# Patient Record
Sex: Female | Born: 1990 | Race: Black or African American | Hispanic: No | Marital: Single | Smoking: Never smoker
Health system: Southern US, Community
[De-identification: ages and names within clinical notes are randomized; demographics above are authoritative.]

## PROBLEM LIST (undated history)

## (undated) ENCOUNTER — Inpatient Hospital Stay (HOSPITAL_COMMUNITY): Payer: Self-pay

## (undated) DIAGNOSIS — D649 Anemia, unspecified: Secondary | ICD-10-CM

## (undated) DIAGNOSIS — D573 Sickle-cell trait: Secondary | ICD-10-CM

## (undated) HISTORY — PX: UMBILICAL HERNIA REPAIR: SHX196

---
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
## 2009-07-12 ENCOUNTER — Emergency Department (HOSPITAL_COMMUNITY): Admission: EM | Admit: 2009-07-12 | Discharge: 2009-07-12 | Payer: Self-pay | Admitting: Emergency Medicine

## 2009-07-12 IMAGING — CT CT ABD-PELV W/O CM
2 of 4 series · 17 of 46 positions shown, 19 images · non-contrast
Comparison: None

CLINICAL DATA: Right flank and anterior abdominal pain for 1 week
getting progressive worse.

CT ABDOMEN AND PELVIS WITHOUT CONTRAST
TECHNIQUE: Multidetector CT imaging of the abdomen and pelvis was
performed following the standard protocol without intravenous
contrast.

[Series 2: under 200# stone no prev · axial · 0.67mm/px · z∈[-512,-72]mm · 14 of 96 slices shown, 16 images]
[im 4/96  soft-tissue]
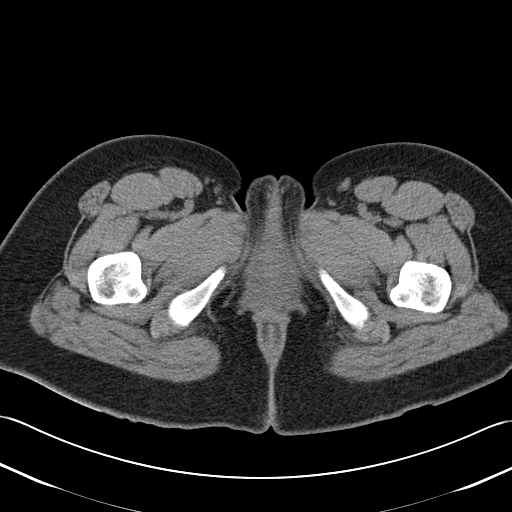
[im 4/96  bone]
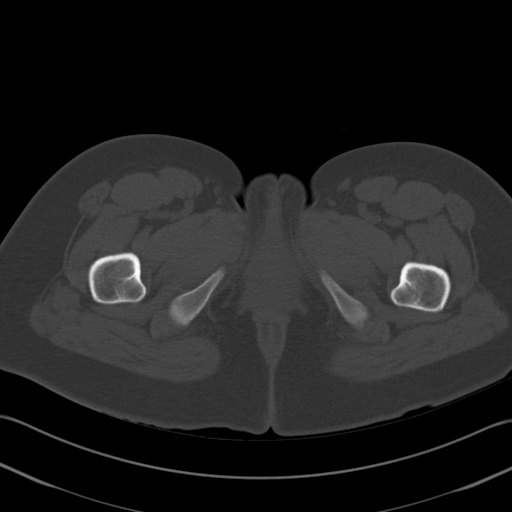
[im 11/96  soft-tissue]
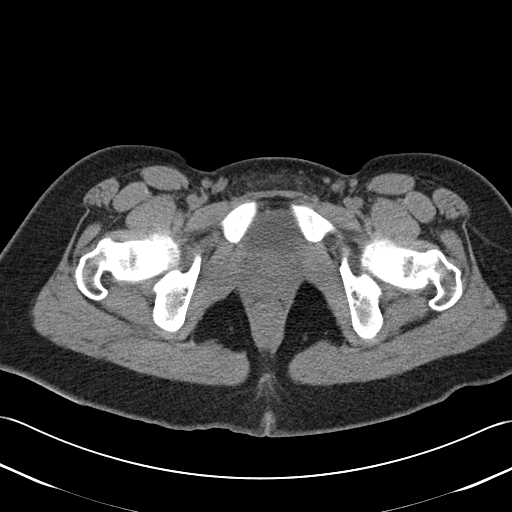
[im 19/96  soft-tissue]
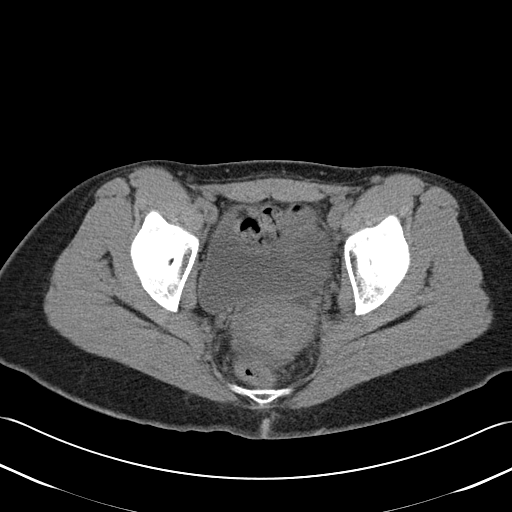
[im 26/96  soft-tissue]
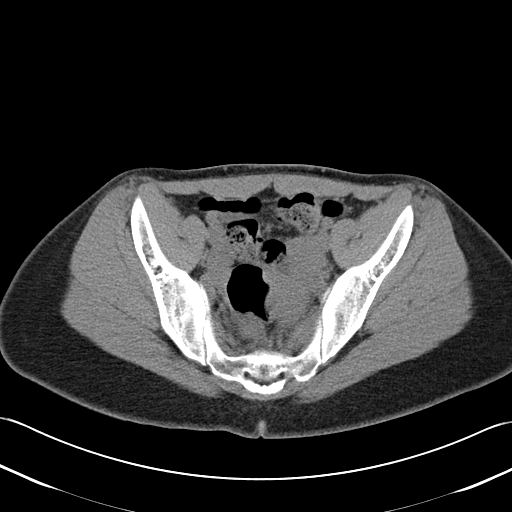
[im 33/96  soft-tissue]
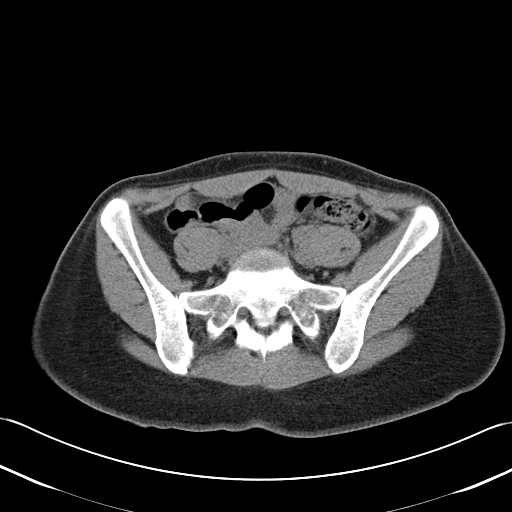
[im 37/96  soft-tissue]
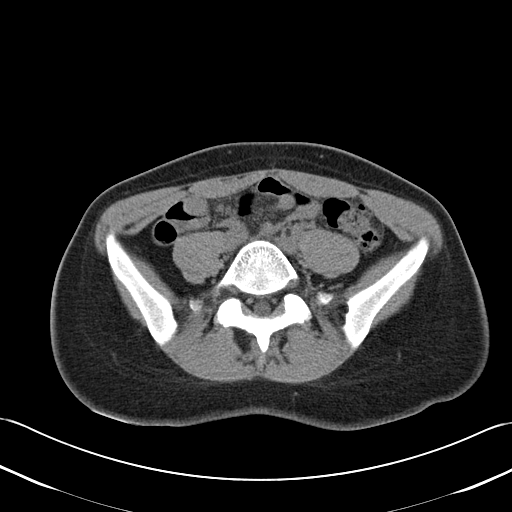
[im 44/96  soft-tissue]
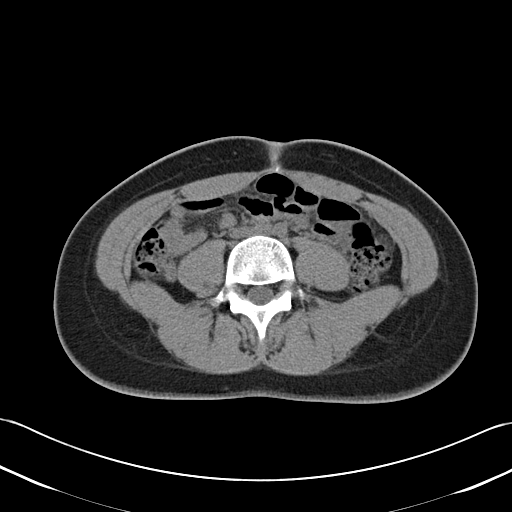
[im 52/96  soft-tissue]
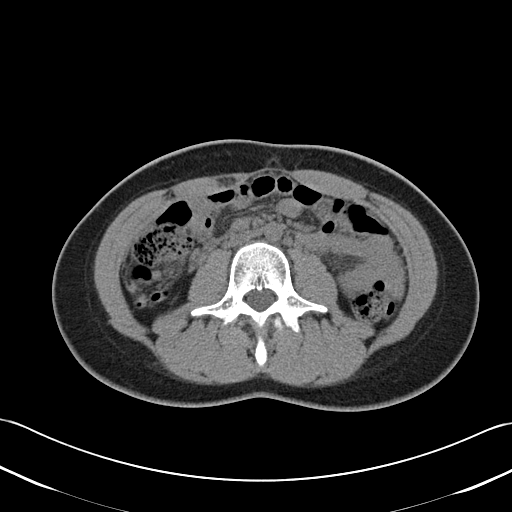
[im 59/96  soft-tissue]
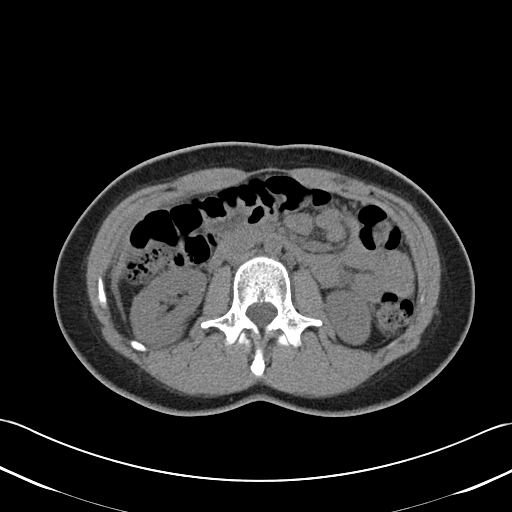
[im 59/96  bone]
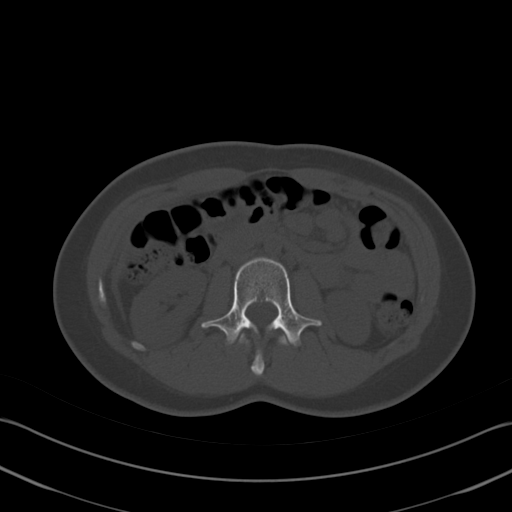
[im 63/96  soft-tissue]
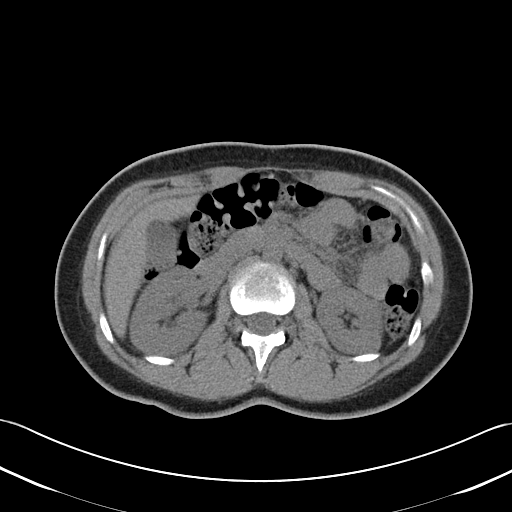
[im 70/96  soft-tissue]
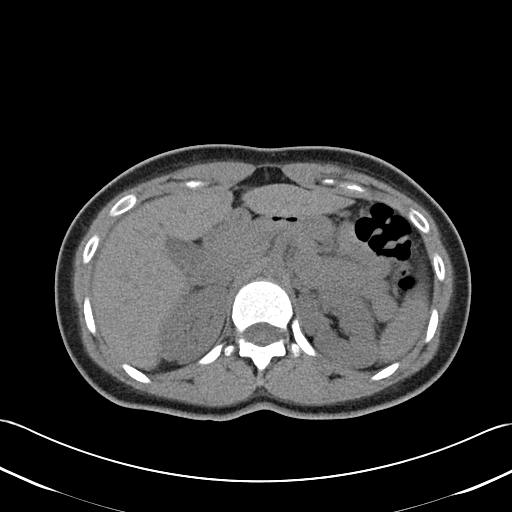
[im 77/96  soft-tissue]
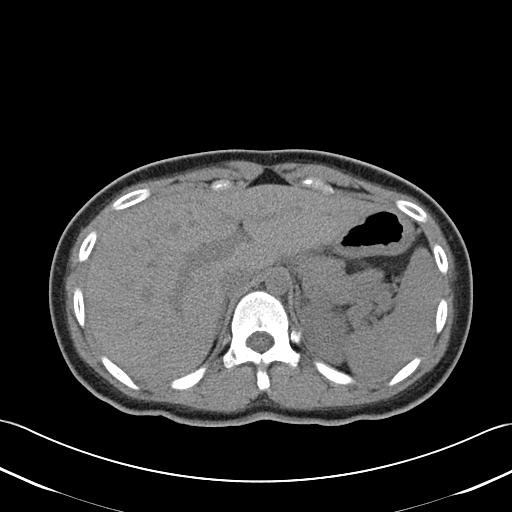
[im 85/96  soft-tissue]
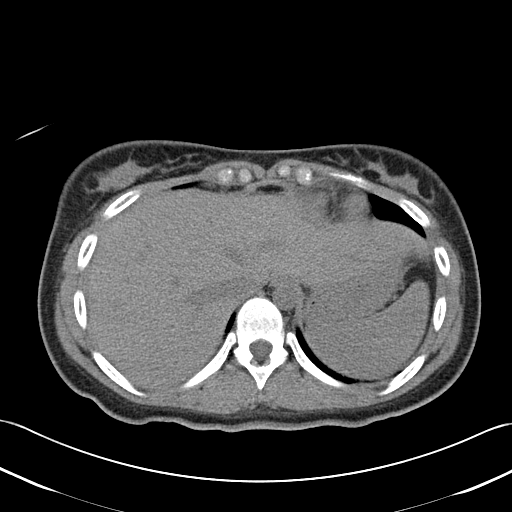
[im 92/96  soft-tissue]
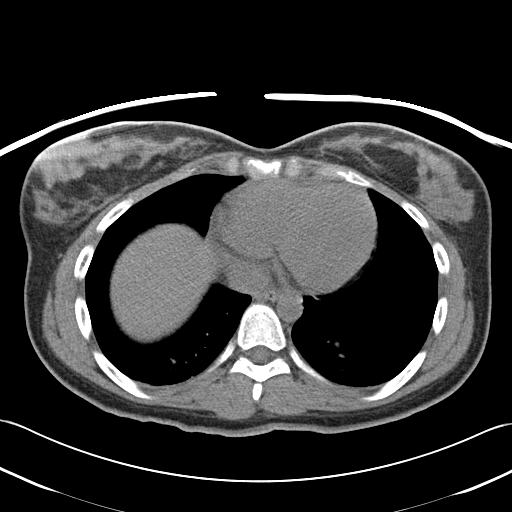

[Series 602: <mpr thick range> · coronal · 0.94mm/px · 3 of 65 slices shown]
[im 22/65  soft-tissue]
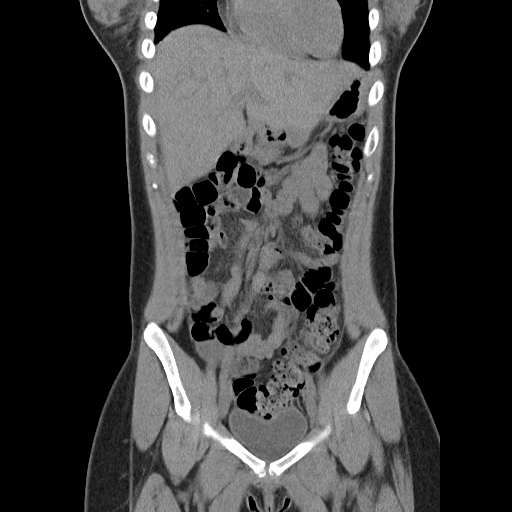
[im 29/65  soft-tissue]
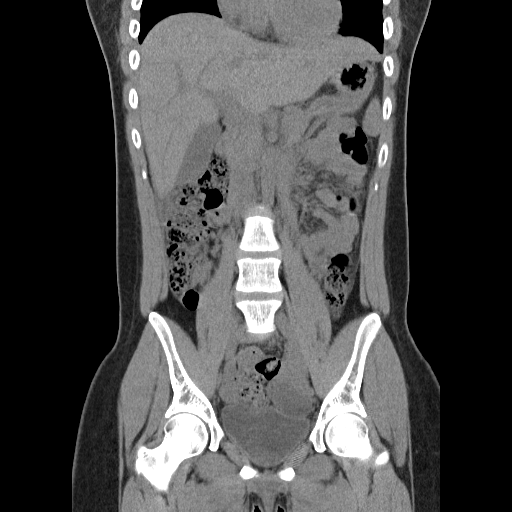
[im 36/65  soft-tissue]
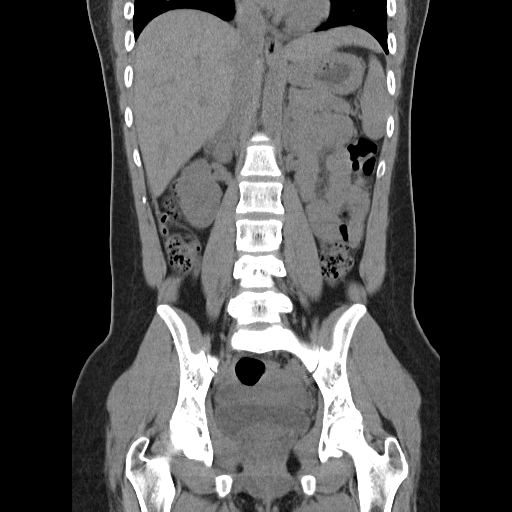

[17 of 46 positions shown; findings below may reference images not displayed]

FINDINGS: The liver, spleen, pancreas, adrenal glands, and kidneys
are normal.  The appendix and terminal ileum are normal. There is
minimal nonspecific fluid in the right pericolic gutter just above
the appendix which is in the right mid abdomen. There is no free
air or free fluid in the abdomen.

The uterus is normal.  The left ovary measures  4 cm and the right
ovary measures  3.2 cm.  There may be cysts on both ovaries.  There
is a tiny amount of free fluid in the pelvis, nonspecific.
IMPRESSION: Slight prominence of  the left ovary with a possible ovarian cyst.

Tiny amount of fluid in the right pericolic gutter adjacent to the
appendix in the right mid abdomen, nonspecific.  The appendix
appears normal.

## 2009-07-15 ENCOUNTER — Emergency Department (HOSPITAL_COMMUNITY): Admission: EM | Admit: 2009-07-15 | Discharge: 2009-07-16 | Payer: Self-pay | Admitting: Emergency Medicine

## 2010-06-19 NOTE — L&D Delivery Note (Signed)
Delivery Note At 1:52 AM a viable female was delivered via Vaginal, Spontaneous Delivery (Presentation: Left Occiput Anterior).  APGAR: 8, 9; weight 6 lb 8 oz (2948 g).   Placenta status: Intact, Spontaneous.  Cord: 3 vessels with the following complications: None.  Cord pH: not done  Anesthesia: Epidural  Episiotomy:  Lacerations: 1st degree Suture Repair: 2.0 vicryl Est. Blood Loss (mL):   Mom to postpartum.  Baby to nursery-stable.  MARSHALL,BERNARD A 04/04/2011, 2:06 AM

## 2010-08-20 ENCOUNTER — Inpatient Hospital Stay (INDEPENDENT_AMBULATORY_CARE_PROVIDER_SITE_OTHER)
Admission: RE | Admit: 2010-08-20 | Discharge: 2010-08-20 | Disposition: A | Discharge status: Home / Self Care | Payer: Self-pay | Source: Ambulatory Visit | Attending: Family Medicine | Admitting: Family Medicine

## 2010-08-20 DIAGNOSIS — L03019 Cellulitis of unspecified finger: Secondary | ICD-10-CM

## 2010-09-05 LAB — CBC
HCT: 32.8 % — ABNORMAL LOW (ref 36.0–46.0)
Hemoglobin: 11 g/dL — ABNORMAL LOW (ref 12.0–15.0)
MCHC: 33.5 g/dL (ref 30.0–36.0)
WBC: 6.2 10*3/uL (ref 4.0–10.5)

## 2010-09-05 LAB — POCT PREGNANCY, URINE: Preg Test, Ur: NEGATIVE

## 2010-09-05 LAB — URINALYSIS, ROUTINE W REFLEX MICROSCOPIC
Bilirubin Urine: NEGATIVE
Glucose, UA: NEGATIVE mg/dL
Specific Gravity, Urine: 1.013 (ref 1.005–1.030)

## 2010-09-05 LAB — COMPREHENSIVE METABOLIC PANEL
ALT: 9 U/L (ref 0–35)
AST: 13 U/L (ref 0–37)
CO2: 25 mEq/L (ref 19–32)
Calcium: 9 mg/dL (ref 8.4–10.5)
GFR calc Af Amer: >60 mL/min (ref >60)
Potassium: 3.2 mEq/L — ABNORMAL LOW (ref 3.5–5.1)
Sodium: 139 mEq/L (ref 135–145)
Total Bilirubin: 0.3 mg/dL (ref 0.3–1.2)
Total Protein: 7.1 g/dL (ref 6.0–8.3)

## 2010-09-05 LAB — DIFFERENTIAL
Basophils Absolute: 0 10*3/uL (ref 0.0–0.1)
Basophils Relative: 0 % (ref 0–1)
Eosinophils Absolute: 0.1 10*3/uL (ref 0.0–0.7)
Lymphocytes Relative: 18 % (ref 12–46)
Monocytes Absolute: 0.4 10*3/uL (ref 0.1–1.0)
Monocytes Relative: 6 % (ref 3–12)
Neutrophils Relative %: 75 % (ref 43–77)

## 2010-09-05 LAB — PREGNANCY, URINE: Preg Test, Ur: NEGATIVE

## 2010-11-04 NOTE — Op Note (Signed)
   NAME:  Dana Mcconnell, Dana Mcconnell                        ACCOUNT NO.:  [id number]   MEDICAL RECORD NO.:  [id number]                   PATIENT TYPE:  AMB   LOCATION:  DSC                                  FACILITY:  [facility]   PHYSICIAN:  Kathy Breach, M.D.                   DATE OF BIRTH:  Sep 03, 1990   DATE OF PROCEDURE:  01/16/2003  DATE OF DISCHARGE:                                 OPERATIVE REPORT   PREOPERATIVE DIAGNOSIS:  1. Mild conductive hearing loss AS.  2. Hyperplastic obstructive tonsils.   POSTOPERATIVE DIAGNOSIS:  1. Mild conductive hearing loss AS.  2. Hyperplastic obstructive tonsils.   OPERATIVE PROCEDURE:  1. Examination under anesthesia, ears.  2. Adenotonsillectomy.   DESCRIPTION OF PROCEDURE:  Under visualization with the operating  microscope, the right external canal was cleaned of dark, moist waxy  material.  She has small external canal.  With the canal completely clean  which she would not allow in the office, the eardrum was evaluated by  microscopic visualization and appeared completely normal.  The left canal,  similarly, was cleaned adequately for visualization.  The patient had a  moderate amount of tympanosclerotic plaque in the posterior inferior  quadrant.  She had a 1 by 3 mm atrophic area anterior inferior quadrant but,  otherwise, normal, intact, nonretracted tympanic membrane with no added  retraction.   A Crowe-Davis mouth gag was inserted, the patient put in the [place]ison position.  Inspection of the oral cavity revealed 4+ enlarged tonsils extending into  the pharynx.  The soft palate was normal configuration.  The hard palate was  normal to palpation.  The tonsils were nonpulsatile on palpation.  The  nasopharyngeal mirror examination revealed fairly minimal midline adenoid  pad present.  The left tonsil was grasped at the superior pole and removed  by electrical dissection maintaining complete hemostasis with  electrocautery.  A similar procedure was  done for the right tonsil.  A red  rubber catheter was then passed through the left nasal chamber and used to  elevate the soft palate.  Under mirror visualization with suction cautery,  complete ablation of the adenoid tissue present in the nasopharynx was  completed.  Blood loss for the procedure was less than 10 mL.  The patient  tolerated the procedure well and was taken to the recovery room in stable,  general condition.                                               Kathy Breach, M.D.    Venia Minks  D:  01/16/2003  T:  01/16/2003  Job:  [id number]

## 2010-11-07 ENCOUNTER — Emergency Department (HOSPITAL_COMMUNITY)
Admission: EM | Admit: 2010-11-07 | Discharge: 2010-11-07 | Disposition: A | Discharge status: Home / Self Care | Payer: Medicaid Other | Attending: Emergency Medicine | Admitting: Emergency Medicine

## 2010-11-07 ENCOUNTER — Other Ambulatory Visit: Payer: Self-pay | Admitting: Obstetrics & Gynecology

## 2010-11-07 ENCOUNTER — Inpatient Hospital Stay (HOSPITAL_COMMUNITY)
Admission: AD | Admit: 2010-11-07 | Discharge: 2010-11-07 | Disposition: A | Discharge status: Home / Self Care | Payer: Medicaid Other | Source: Ambulatory Visit | Attending: Obstetrics & Gynecology | Admitting: Obstetrics & Gynecology

## 2010-11-07 DIAGNOSIS — N390 Urinary tract infection, site not specified: Secondary | ICD-10-CM

## 2010-11-07 DIAGNOSIS — O99019 Anemia complicating pregnancy, unspecified trimester: Secondary | ICD-10-CM | POA: Insufficient documentation

## 2010-11-07 DIAGNOSIS — O2390 Unspecified genitourinary tract infection in pregnancy, unspecified trimester: Secondary | ICD-10-CM | POA: Insufficient documentation

## 2010-11-07 DIAGNOSIS — N949 Unspecified condition associated with female genital organs and menstrual cycle: Secondary | ICD-10-CM | POA: Insufficient documentation

## 2010-11-07 DIAGNOSIS — R51 Headache: Secondary | ICD-10-CM | POA: Insufficient documentation

## 2010-11-07 DIAGNOSIS — O9989 Other specified diseases and conditions complicating pregnancy, childbirth and the puerperium: Secondary | ICD-10-CM

## 2010-11-07 DIAGNOSIS — R630 Anorexia: Secondary | ICD-10-CM | POA: Insufficient documentation

## 2010-11-07 DIAGNOSIS — R109 Unspecified abdominal pain: Secondary | ICD-10-CM | POA: Insufficient documentation

## 2010-11-07 DIAGNOSIS — J45909 Unspecified asthma, uncomplicated: Secondary | ICD-10-CM | POA: Insufficient documentation

## 2010-11-07 DIAGNOSIS — D649 Anemia, unspecified: Secondary | ICD-10-CM | POA: Insufficient documentation

## 2010-11-07 DIAGNOSIS — R350 Frequency of micturition: Secondary | ICD-10-CM | POA: Insufficient documentation

## 2010-11-07 LAB — URINALYSIS, ROUTINE W REFLEX MICROSCOPIC
Bilirubin Urine: NEGATIVE
Hgb urine dipstick: NEGATIVE
Ketones, ur: NEGATIVE mg/dL
Nitrite: NEGATIVE

## 2010-11-07 LAB — URINE MICROSCOPIC-ADD ON

## 2010-11-08 ENCOUNTER — Other Ambulatory Visit: Payer: Self-pay | Admitting: Obstetrics & Gynecology

## 2010-11-08 DIAGNOSIS — Z3689 Encounter for other specified antenatal screening: Secondary | ICD-10-CM

## 2010-11-08 LAB — CBC
HCT: 28.8 % — ABNORMAL LOW (ref 36.0–46.0)
Hemoglobin: 9.6 g/dL — ABNORMAL LOW (ref 12.0–15.0)
MCH: 30.5 pg (ref 26.0–34.0)
MCV: 91.4 fL (ref 78.0–100.0)
RBC: 3.15 MIL/uL — ABNORMAL LOW (ref 3.87–5.11)
WBC: 7.4 10*3/uL (ref 4.0–10.5)

## 2010-11-08 LAB — WET PREP, GENITAL
Clue Cells Wet Prep HPF POC: NONE SEEN
Trich, Wet Prep: NONE SEEN

## 2010-11-08 LAB — URINALYSIS, ROUTINE W REFLEX MICROSCOPIC
Hgb urine dipstick: NEGATIVE
Specific Gravity, Urine: 1.015 (ref 1.005–1.030)
Urobilinogen, UA: 0.2 mg/dL (ref 0.0–1.0)
pH: 6 (ref 5.0–8.0)

## 2010-11-08 LAB — URINE MICROSCOPIC-ADD ON

## 2010-11-08 LAB — GC/CHLAMYDIA PROBE AMP, GENITAL: Chlamydia, DNA Probe: NEGATIVE

## 2010-11-09 ENCOUNTER — Ambulatory Visit (HOSPITAL_COMMUNITY)
Admission: RE | Admit: 2010-11-09 | Discharge: 2010-11-09 | Disposition: A | Discharge status: Home / Self Care | Payer: Medicaid Other | Source: Ambulatory Visit | Attending: Obstetrics & Gynecology | Admitting: Obstetrics & Gynecology

## 2010-11-09 DIAGNOSIS — O358XX0 Maternal care for other (suspected) fetal abnormality and damage, not applicable or unspecified: Secondary | ICD-10-CM | POA: Insufficient documentation

## 2010-11-09 DIAGNOSIS — O0930 Supervision of pregnancy with insufficient antenatal care, unspecified trimester: Secondary | ICD-10-CM | POA: Insufficient documentation

## 2010-11-09 DIAGNOSIS — Z3689 Encounter for other specified antenatal screening: Secondary | ICD-10-CM

## 2010-11-09 DIAGNOSIS — Z363 Encounter for antenatal screening for malformations: Secondary | ICD-10-CM | POA: Insufficient documentation

## 2010-11-09 DIAGNOSIS — Z1389 Encounter for screening for other disorder: Secondary | ICD-10-CM | POA: Insufficient documentation

## 2010-11-10 LAB — URINE CULTURE
Colony Count: 1000
Culture  Setup Time: 201205221400

## 2010-12-03 ENCOUNTER — Inpatient Hospital Stay (HOSPITAL_COMMUNITY)
Admission: AD | Admit: 2010-12-03 | Discharge: 2010-12-03 | Disposition: A | Discharge status: Home / Self Care | Payer: Medicaid Other | Source: Ambulatory Visit | Attending: Obstetrics & Gynecology | Admitting: Obstetrics & Gynecology

## 2010-12-03 DIAGNOSIS — A5901 Trichomonal vulvovaginitis: Secondary | ICD-10-CM

## 2010-12-03 DIAGNOSIS — O98819 Other maternal infectious and parasitic diseases complicating pregnancy, unspecified trimester: Secondary | ICD-10-CM

## 2010-12-03 DIAGNOSIS — N949 Unspecified condition associated with female genital organs and menstrual cycle: Secondary | ICD-10-CM | POA: Insufficient documentation

## 2010-12-03 DIAGNOSIS — O0930 Supervision of pregnancy with insufficient antenatal care, unspecified trimester: Secondary | ICD-10-CM | POA: Insufficient documentation

## 2010-12-03 LAB — URINALYSIS, ROUTINE W REFLEX MICROSCOPIC
Glucose, UA: NEGATIVE mg/dL
Hgb urine dipstick: NEGATIVE
Nitrite: NEGATIVE
Specific Gravity, Urine: 1.02 (ref 1.005–1.030)
pH: 8 (ref 5.0–8.0)

## 2010-12-03 LAB — URINE MICROSCOPIC-ADD ON

## 2010-12-18 ENCOUNTER — Inpatient Hospital Stay (HOSPITAL_COMMUNITY)
Admission: AD | Admit: 2010-12-18 | Discharge: 2010-12-18 | Disposition: A | Discharge status: Home / Self Care | Payer: Medicaid Other | Source: Ambulatory Visit | Attending: Obstetrics & Gynecology | Admitting: Obstetrics & Gynecology

## 2010-12-18 DIAGNOSIS — IMO0002 Reserved for concepts with insufficient information to code with codable children: Secondary | ICD-10-CM | POA: Insufficient documentation

## 2010-12-18 LAB — URINALYSIS, ROUTINE W REFLEX MICROSCOPIC
Bilirubin Urine: NEGATIVE
Hgb urine dipstick: NEGATIVE
Nitrite: NEGATIVE
Specific Gravity, Urine: 1.02 (ref 1.005–1.030)
Urobilinogen, UA: 0.2 mg/dL (ref 0.0–1.0)
pH: 6 (ref 5.0–8.0)

## 2011-01-16 LAB — RPR: RPR: NONREACTIVE

## 2011-01-16 LAB — RUBELLA ANTIBODY, IGM: Rubella: IMMUNE

## 2011-01-16 LAB — GC/CHLAMYDIA PROBE AMP, GENITAL: Gonorrhea: NEGATIVE

## 2011-01-16 LAB — HIV ANTIBODY (ROUTINE TESTING W REFLEX)
HIV: NONREACTIVE
HIV: NONREACTIVE
HIV: NONREACTIVE

## 2011-01-16 LAB — HEPATITIS B SURFACE ANTIGEN: Hepatitis B Surface Ag: NEGATIVE

## 2011-01-16 LAB — ANTIBODY SCREEN: Antibody Screen: NEGATIVE

## 2011-01-16 LAB — ABO/RH: RH Type: POSITIVE

## 2011-01-30 ENCOUNTER — Inpatient Hospital Stay (HOSPITAL_COMMUNITY)
Admission: AD | Admit: 2011-01-30 | Discharge: 2011-01-30 | Disposition: A | Discharge status: Home / Self Care | Payer: Medicaid Other | Source: Ambulatory Visit | Attending: Obstetrics | Admitting: Obstetrics

## 2011-01-30 ENCOUNTER — Encounter (HOSPITAL_COMMUNITY): Payer: Self-pay | Admitting: *Deleted

## 2011-01-30 DIAGNOSIS — R109 Unspecified abdominal pain: Secondary | ICD-10-CM | POA: Insufficient documentation

## 2011-01-30 DIAGNOSIS — O99891 Other specified diseases and conditions complicating pregnancy: Secondary | ICD-10-CM | POA: Insufficient documentation

## 2011-01-30 DIAGNOSIS — N949 Unspecified condition associated with female genital organs and menstrual cycle: Secondary | ICD-10-CM

## 2011-01-30 MED ORDER — ACETAMINOPHEN 325 MG PO TABS
650.0000 mg | ORAL_TABLET | Freq: Once | ORAL | Status: AC
  Administered 2011-01-30: 650 mg via ORAL
  Filled 2011-01-30: qty 2

## 2011-01-30 NOTE — Progress Notes (Signed)
DENIES HSV AND MRSA. NOT SROM

## 2011-01-30 NOTE — Progress Notes (Signed)
Pt says at 0700   SHE STARTED HAVING PAIN IN HER R HIP.  HAS NOT TAKEN ANYTHING  FOR PAIN.  PAIN IS SAME  THRU DAY.    WALKING INTO TRIAGE - LIMPING.

## 2011-01-30 NOTE — ED Provider Notes (Signed)
No chief complaint on file.   S: Dana Mcconnell is a 20 y.o. G1 at 31 weeks presenting with 1 day hx of sharp continuous pain in r.t hip and rt. groin. The pain is exacerbated by walking and changing positions.Now at rest she denies pain.  No dysuria, urgency or frequency. She denies contractions, vaginal bleeding or leakage of fluid. Fetus is active.  ROS: Rt frontal headache and has not tried Tylenol Pregnancy uncomplicated except iron defic anemia.  O: Filed Vitals:   01/30/11 2022  BP: 115/58  Pulse: 86  Temp:   Resp: 20    Gen: NAD Mouth: gum hypertrophy and erythema Abd: soft, mildly tender rt groin. NT over rt hip.  Cx: L/C/H FHR: 145 baseline;  Moderate variability, Accelerations15 bpm, Decelerations none UCs: none, sl UI  No results found for this or any previous visit (from the past 24 hour(s)).  A: Round Ligament Pain  P:  Reassurance given and general relief measures reviewed: avoidance of precipitating movements, instructions on abdominal tightening/pelvic rock exercises, abdominal binder, rest with hip flexion. Tx headache with Tylenol. Will give 1000 mg po now.  Work excuse x 2 days. F/U Dr. Gaynell Face

## 2011-03-16 ENCOUNTER — Other Ambulatory Visit (HOSPITAL_COMMUNITY): Payer: Self-pay | Admitting: Obstetrics

## 2011-03-16 DIAGNOSIS — IMO0002 Reserved for concepts with insufficient information to code with codable children: Secondary | ICD-10-CM

## 2011-03-20 ENCOUNTER — Ambulatory Visit (HOSPITAL_COMMUNITY)
Admission: RE | Admit: 2011-03-20 | Discharge: 2011-03-20 | Disposition: A | Discharge status: Home / Self Care | Payer: Medicaid Other | Source: Ambulatory Visit | Attending: Obstetrics | Admitting: Obstetrics

## 2011-03-20 VITALS — BP 112/71 | HR 90 | Wt 183.0 lb

## 2011-03-20 DIAGNOSIS — O365990 Maternal care for other known or suspected poor fetal growth, unspecified trimester, not applicable or unspecified: Secondary | ICD-10-CM | POA: Insufficient documentation

## 2011-03-20 DIAGNOSIS — IMO0002 Reserved for concepts with insufficient information to code with codable children: Secondary | ICD-10-CM

## 2011-03-20 DIAGNOSIS — O358XX0 Maternal care for other (suspected) fetal abnormality and damage, not applicable or unspecified: Secondary | ICD-10-CM | POA: Insufficient documentation

## 2011-03-20 DIAGNOSIS — O0930 Supervision of pregnancy with insufficient antenatal care, unspecified trimester: Secondary | ICD-10-CM | POA: Insufficient documentation

## 2011-03-20 NOTE — Progress Notes (Signed)
Report in [facility]EP[facility]; follow-up as needed 

## 2011-03-20 NOTE — Progress Notes (Signed)
Encounter addended by: Marlana Latus, RN on: 03/20/2011 11:40 AM<BR>     Documentation filed: Episodes, Chief Complaint Section

## 2011-03-23 ENCOUNTER — Inpatient Hospital Stay (HOSPITAL_COMMUNITY)
Admission: AD | Admit: 2011-03-23 | Discharge: 2011-03-23 | Disposition: A | Discharge status: Home / Self Care | Payer: Medicaid Other | Source: Ambulatory Visit | Attending: Obstetrics | Admitting: Obstetrics

## 2011-03-23 ENCOUNTER — Encounter (HOSPITAL_COMMUNITY): Payer: Self-pay | Admitting: *Deleted

## 2011-03-23 DIAGNOSIS — M545 Low back pain, unspecified: Secondary | ICD-10-CM | POA: Insufficient documentation

## 2011-03-23 DIAGNOSIS — O99891 Other specified diseases and conditions complicating pregnancy: Secondary | ICD-10-CM | POA: Insufficient documentation

## 2011-03-23 NOTE — ED Notes (Signed)
Dr Gaynell Face notified of pt arrival with reactive tracing, cx closed/ thick,-3. Order received for discharge rexcd.

## 2011-03-23 NOTE — Progress Notes (Signed)
Pt states she has constant low back pain, sometimes less painful. No leaking or bleeding. Reports good fetal movement. FHT's in triage good with audible movement.

## 2011-03-30 ENCOUNTER — Telehealth (HOSPITAL_COMMUNITY): Payer: Self-pay | Admitting: *Deleted

## 2011-03-30 ENCOUNTER — Encounter (HOSPITAL_COMMUNITY): Payer: Self-pay | Admitting: *Deleted

## 2011-03-30 NOTE — Telephone Encounter (Signed)
Preadmission screen  

## 2011-03-31 ENCOUNTER — Encounter (HOSPITAL_COMMUNITY): Payer: Self-pay | Admitting: *Deleted

## 2011-04-03 ENCOUNTER — Inpatient Hospital Stay (HOSPITAL_COMMUNITY)
Admission: RE | Admit: 2011-04-03 | Discharge: 2011-04-06 | DRG: 775 | Disposition: A | Discharge status: Home / Self Care | Payer: Medicaid Other | Source: Ambulatory Visit | Attending: Obstetrics | Admitting: Obstetrics

## 2011-04-03 ENCOUNTER — Encounter (HOSPITAL_COMMUNITY): Payer: Self-pay

## 2011-04-03 ENCOUNTER — Inpatient Hospital Stay (HOSPITAL_COMMUNITY): Payer: Medicaid Other | Admitting: Anesthesiology

## 2011-04-03 ENCOUNTER — Encounter (HOSPITAL_COMMUNITY): Payer: Self-pay | Admitting: Anesthesiology

## 2011-04-03 LAB — RPR: RPR Ser Ql: NONREACTIVE

## 2011-04-03 LAB — CBC
MCV: 93.1 fL (ref 78.0–100.0)
Platelets: 188 10*3/uL (ref 150–400)
RBC: 3.32 MIL/uL — ABNORMAL LOW (ref 3.87–5.11)
WBC: 6 10*3/uL (ref 4.0–10.5)

## 2011-04-03 MED ORDER — DIPHENHYDRAMINE HCL 50 MG/ML IJ SOLN: 12.5000 mg | INTRAMUSCULAR | Status: DC | PRN

## 2011-04-03 MED ORDER — SODIUM CHLORIDE 0.9 % IJ SOLN: 3.0000 mL | Freq: Two times a day (BID) | INTRAMUSCULAR | Status: DC

## 2011-04-03 MED ORDER — EPHEDRINE 5 MG/ML INJ
10.0000 mg | INTRAVENOUS | Status: DC | PRN
  Filled 2011-04-03 (×2): qty 4

## 2011-04-03 MED ORDER — OXYTOCIN 20 UNITS IN LACTATED RINGERS INFUSION - SIMPLE: 125.0000 mL/h | Freq: Once | INTRAVENOUS | Status: DC

## 2011-04-03 MED ORDER — LACTATED RINGERS IV SOLN: 500.0000 mL | INTRAVENOUS | Status: DC | PRN

## 2011-04-03 MED ORDER — BUTORPHANOL TARTRATE 2 MG/ML IJ SOLN
1.0000 mg | INTRAMUSCULAR | Status: DC | PRN
  Administered 2011-04-03 (×2): 1 mg via INTRAVENOUS
  Filled 2011-04-03 (×2): qty 1

## 2011-04-03 MED ORDER — TERBUTALINE SULFATE 1 MG/ML IJ SOLN: 0.2500 mg | Freq: Once | INTRAMUSCULAR | Status: AC | PRN

## 2011-04-03 MED ORDER — OXYTOCIN BOLUS FROM INFUSION
500.0000 mL | Freq: Once | INTRAVENOUS | Status: DC
  Filled 2011-04-03: qty 500

## 2011-04-03 MED ORDER — FLEET ENEMA 7-19 GM/118ML RE ENEM: 1.0000 | ENEMA | RECTAL | Status: DC | PRN

## 2011-04-03 MED ORDER — LIDOCAINE HCL 1.5 % IJ SOLN
INTRAMUSCULAR | Status: DC | PRN
  Administered 2011-04-03 (×2): 5 mL via INTRADERMAL
  Administered 2011-04-03: 2 mL via INTRADERMAL

## 2011-04-03 MED ORDER — PHENYLEPHRINE 40 MCG/ML (10ML) SYRINGE FOR IV PUSH (FOR BLOOD PRESSURE SUPPORT)
80.0000 ug | PREFILLED_SYRINGE | INTRAVENOUS | Status: DC | PRN
  Filled 2011-04-03 (×2): qty 5

## 2011-04-03 MED ORDER — ACETAMINOPHEN 325 MG PO TABS: 650.0000 mg | ORAL_TABLET | ORAL | Status: DC | PRN

## 2011-04-03 MED ORDER — DINOPROSTONE 10 MG VA INST: 10.0000 mg | VAGINAL_INSERT | Freq: Once | VAGINAL | Status: DC

## 2011-04-03 MED ORDER — LIDOCAINE HCL (PF) 1 % IJ SOLN
30.0000 mL | INTRAMUSCULAR | Status: DC | PRN
  Filled 2011-04-03 (×2): qty 30

## 2011-04-03 MED ORDER — ZOLPIDEM TARTRATE 10 MG PO TABS: 10.0000 mg | ORAL_TABLET | Freq: Every evening | ORAL | Status: DC | PRN

## 2011-04-03 MED ORDER — LACTATED RINGERS IV SOLN
500.0000 mL | Freq: Once | INTRAVENOUS | Status: AC
  Administered 2011-04-03: 500 mL via INTRAVENOUS

## 2011-04-03 MED ORDER — PHENYLEPHRINE 40 MCG/ML (10ML) SYRINGE FOR IV PUSH (FOR BLOOD PRESSURE SUPPORT)
80.0000 ug | PREFILLED_SYRINGE | INTRAVENOUS | Status: DC | PRN
  Filled 2011-04-03: qty 5

## 2011-04-03 MED ORDER — SODIUM CHLORIDE 0.9 % IV SOLN: 250.0000 mL | INTRAVENOUS | Status: DC

## 2011-04-03 MED ORDER — CITRIC ACID-SODIUM CITRATE 334-500 MG/5ML PO SOLN: 30.0000 mL | ORAL | Status: DC | PRN

## 2011-04-03 MED ORDER — IBUPROFEN 600 MG PO TABS: 600.0000 mg | ORAL_TABLET | Freq: Four times a day (QID) | ORAL | Status: DC | PRN

## 2011-04-03 MED ORDER — ONDANSETRON HCL 4 MG/2ML IJ SOLN: 4.0000 mg | Freq: Four times a day (QID) | INTRAMUSCULAR | Status: DC | PRN

## 2011-04-03 MED ORDER — PROMETHAZINE HCL 25 MG/ML IJ SOLN
12.5000 mg | INTRAMUSCULAR | Status: DC | PRN
  Administered 2011-04-03 (×2): 12.5 mg via INTRAVENOUS
  Filled 2011-04-03 (×2): qty 1

## 2011-04-03 MED ORDER — MISOPROSTOL 25 MCG QUARTER TABLET: 25.0000 ug | ORAL_TABLET | ORAL | Status: DC | PRN

## 2011-04-03 MED ORDER — EPHEDRINE 5 MG/ML INJ
10.0000 mg | INTRAVENOUS | Status: DC | PRN
  Filled 2011-04-03: qty 4

## 2011-04-03 MED ORDER — FENTANYL 2.5 MCG/ML BUPIVACAINE 1/10 % EPIDURAL INFUSION (WH - ANES)
14.0000 mL/h | INTRAMUSCULAR | Status: DC
  Administered 2011-04-03 (×2): 14 mL/h via EPIDURAL
  Filled 2011-04-03 (×2): qty 60

## 2011-04-03 MED ORDER — SODIUM CHLORIDE 0.9 % IJ SOLN: 3.0000 mL | INTRAMUSCULAR | Status: DC | PRN

## 2011-04-03 MED ORDER — LACTATED RINGERS IV SOLN
INTRAVENOUS | Status: DC
  Administered 2011-04-03 (×5): via INTRAVENOUS

## 2011-04-03 MED ORDER — OXYTOCIN 20 UNITS IN LACTATED RINGERS INFUSION - SIMPLE
1.0000 m[IU]/min | INTRAVENOUS | Status: DC
  Administered 2011-04-03: 2 m[IU]/min via INTRAVENOUS
  Administered 2011-04-03: 10 m[IU]/min via INTRAVENOUS
  Filled 2011-04-03: qty 1000

## 2011-04-03 MED ORDER — OXYCODONE-ACETAMINOPHEN 5-325 MG PO TABS: 2.0000 | ORAL_TABLET | ORAL | Status: DC | PRN

## 2011-04-03 NOTE — Anesthesia Procedure Notes (Signed)
Epidural Patient location during procedure: OB Start time: 04/03/2011 6:46 PM Reason for block: procedure for pain  Staffing Performed by: anesthesiologist   Preanesthetic Checklist Completed: patient identified, site marked, surgical consent, pre-op evaluation, timeout performed, IV checked, risks and benefits discussed and monitors and equipment checked  Epidural Patient position: sitting Prep: site prepped and draped and DuraPrep Patient monitoring: continuous pulse ox and blood pressure Approach: midline Injection technique: LOR air  Needle:  Needle type: Tuohy  Needle gauge: 17 G Needle length: 9 cm Needle insertion depth: 5 cm cm Catheter type: closed end flexible Catheter size: 19 Gauge Catheter at skin depth: 10 cm Test dose: negative  Assessment Events: blood not aspirated, injection not painful, no injection resistance, negative IV test and no paresthesia  Additional Notes Discussed risk of headache, infection, bleeding, nerve injury and failed or incomplete block.  Patient voices understanding and wishes to proceed.

## 2011-04-03 NOTE — Progress Notes (Signed)
  And and cervix 2 cm 70% vertex -3 station membranes ruptured artificially the fluids clear and Patient contracting every 3 minutes reactive tracing

## 2011-04-03 NOTE — Anesthesia Preprocedure Evaluation (Signed)
Anesthesia Evaluation  Name, MR# and DOB Patient awake  General Assessment Comment  Reviewed: Allergy & Precautions, H&P , NPO status , Patient's Chart, lab work & pertinent test results, reviewed documented beta blocker date and time   History of Anesthesia Complications Negative for: history of anesthetic complications  Airway Mallampati: III TM Distance: >3 FB Neck ROM: full    Dental  (+) Teeth Intact   Pulmonary asthma (last inhaler use months ago)  clear to auscultation        Cardiovascular regular Normal    Neuro/Psych Negative Neurological ROS  Negative Psych ROS   GI/Hepatic negative GI ROS Neg liver ROS    Endo/Other  Negative Endocrine ROS  Renal/GU negative Renal ROS     Musculoskeletal   Abdominal   Peds  Hematology negative hematology ROS (+)   Anesthesia Other Findings   Reproductive/Obstetrics (+) Pregnancy                           Anesthesia Physical Anesthesia Plan  ASA: II  Anesthesia Plan: Epidural   Post-op Pain Management:    Induction:   Airway Management Planned:   Additional Equipment:   Intra-op Plan:   Post-operative Plan:   Informed Consent: I have reviewed the patients History and Physical, chart, labs and discussed the procedure including the risks, benefits and alternatives for the proposed anesthesia with the patient or authorized representative who has indicated his/her understanding and acceptance.     Plan Discussed with:   Anesthesia Plan Comments:         Anesthesia Quick Evaluation

## 2011-04-03 NOTE — H&P (Signed)
This is Dr. Francoise Ceo dictating the history and physical on  Dana Mcconnell she's a 20 year old Gravida 1 at 40 weeks and 2 days the due date of 04/01/2011 brought in for induction Negative GBS and cervix all 1 cm 80% and contracting every 5 minutes Negative GBS and to the started on low-dose Pitocin Past medical history negative Past surgical history negative Family history negative System review negative Physical exam Well-developed female in no distress HEENT negative Breasts negative Lungs clear to percussion auscultation Heart regular rhythm no murmurs no gallops Abdomen term Pelvic as described above Extremities negative

## 2011-04-04 ENCOUNTER — Encounter (HOSPITAL_COMMUNITY): Payer: Self-pay

## 2011-04-04 MED ORDER — ONDANSETRON HCL 4 MG/2ML IJ SOLN: 4.0000 mg | INTRAMUSCULAR | Status: DC | PRN

## 2011-04-04 MED ORDER — BENZOCAINE-MENTHOL 20-0.5 % EX AERO
INHALATION_SPRAY | CUTANEOUS | Status: AC
  Filled 2011-04-04: qty 56

## 2011-04-04 MED ORDER — WITCH HAZEL-GLYCERIN EX PADS: 1.0000 "application " | MEDICATED_PAD | CUTANEOUS | Status: DC | PRN

## 2011-04-04 MED ORDER — ONDANSETRON HCL 4 MG PO TABS: 4.0000 mg | ORAL_TABLET | ORAL | Status: DC | PRN

## 2011-04-04 MED ORDER — SENNOSIDES-DOCUSATE SODIUM 8.6-50 MG PO TABS
2.0000 | ORAL_TABLET | Freq: Every day | ORAL | Status: DC
  Administered 2011-04-04: 2 via ORAL

## 2011-04-04 MED ORDER — INFLUENZA VIRUS VACC SPLIT PF IM SUSP
0.5000 mL | Freq: Once | INTRAMUSCULAR | Status: AC
  Administered 2011-04-06: 0.5 mL via INTRAMUSCULAR
  Filled 2011-04-04: qty 0.5

## 2011-04-04 MED ORDER — FERROUS SULFATE 325 (65 FE) MG PO TABS
325.0000 mg | ORAL_TABLET | Freq: Two times a day (BID) | ORAL | Status: DC
  Administered 2011-04-04 – 2011-04-06 (×5): 325 mg via ORAL
  Filled 2011-04-04 (×5): qty 1

## 2011-04-04 MED ORDER — DIPHENHYDRAMINE HCL 25 MG PO CAPS: 25.0000 mg | ORAL_CAPSULE | Freq: Four times a day (QID) | ORAL | Status: DC | PRN

## 2011-04-04 MED ORDER — OXYCODONE-ACETAMINOPHEN 5-325 MG PO TABS: 1.0000 | ORAL_TABLET | ORAL | Status: DC | PRN

## 2011-04-04 MED ORDER — IBUPROFEN 600 MG PO TABS
600.0000 mg | ORAL_TABLET | Freq: Four times a day (QID) | ORAL | Status: DC
  Administered 2011-04-04 – 2011-04-06 (×9): 600 mg via ORAL
  Filled 2011-04-04 (×9): qty 1

## 2011-04-04 MED ORDER — PRENATAL PLUS 27-1 MG PO TABS
1.0000 | ORAL_TABLET | Freq: Every day | ORAL | Status: DC
  Administered 2011-04-04 – 2011-04-06 (×3): 1 via ORAL
  Filled 2011-04-04 (×3): qty 1

## 2011-04-04 MED ORDER — TETANUS-DIPHTH-ACELL PERTUSSIS 5-2.5-18.5 LF-MCG/0.5 IM SUSP
0.5000 mL | Freq: Once | INTRAMUSCULAR | Status: AC
  Administered 2011-04-05: 0.5 mL via INTRAMUSCULAR
  Filled 2011-04-04: qty 0.5

## 2011-04-04 MED ORDER — LANOLIN HYDROUS EX OINT: TOPICAL_OINTMENT | CUTANEOUS | Status: DC | PRN

## 2011-04-04 MED ORDER — DIBUCAINE 1 % RE OINT: 1.0000 "application " | TOPICAL_OINTMENT | RECTAL | Status: DC | PRN

## 2011-04-04 MED ORDER — BENZOCAINE-MENTHOL 20-0.5 % EX AERO
1.0000 "application " | INHALATION_SPRAY | CUTANEOUS | Status: DC | PRN
  Administered 2011-04-04: 09:00:00 via TOPICAL

## 2011-04-04 MED ORDER — SIMETHICONE 80 MG PO CHEW: 80.0000 mg | CHEWABLE_TABLET | ORAL | Status: DC | PRN

## 2011-04-04 MED ORDER — ZOLPIDEM TARTRATE 5 MG PO TABS: 5.0000 mg | ORAL_TABLET | Freq: Every evening | ORAL | Status: DC | PRN

## 2011-04-04 NOTE — Anesthesia Postprocedure Evaluation (Signed)
  Anesthesia Post-op Note  Patient: Dana Mcconnell  Procedure(s) Performed: * No procedures listed *  Patient Location: Mother/Baby  Anesthesia Type: Epidural  Level of Consciousness: awake, alert  and oriented  Airway and Oxygen Therapy: Patient Spontanous Breathing  Post-op Pain: mild  Post-op Assessment: Patient's Cardiovascular Status Stable, Respiratory Function Stable, Patent Airway, No signs of Nausea or vomiting, Adequate PO intake and Pain level controlled  Post-op Vital Signs: stable  Complications: No apparent anesthesia complications

## 2011-04-04 NOTE — Progress Notes (Signed)
  Patient delivered after midnight Fundus firm Lochia moderate Legs negative No complaints and and

## 2011-04-04 NOTE — Progress Notes (Signed)
UR chart review completed.  

## 2011-04-05 LAB — CBC
HCT: 24.7 % — ABNORMAL LOW (ref 36.0–46.0)
MCHC: 34 g/dL (ref 30.0–36.0)
Platelets: 132 10*3/uL — ABNORMAL LOW (ref 150–400)
RDW: 12.5 % (ref 11.5–15.5)
WBC: 9.5 10*3/uL (ref 4.0–10.5)

## 2011-04-05 NOTE — Progress Notes (Signed)
Postpartum day one Vital signs normal Fundus firm Lochia moderate Legs negative No complaints    Postpartum day one Vital signs normal Fundus firm Lochia moderate Legs negative No complaints

## 2011-04-06 NOTE — Discharge Summary (Signed)
Obstetric Discharge Summary Reason for Admission: onset of labor Prenatal Procedures: none Intrapartum Procedures: spontaneous vaginal delivery Postpartum Procedures: none Complications-Operative and Postpartum: none Hemoglobin  Date Value Range Status  04/05/2011 8.4* 12.0-15.0 (g/dL) Final     DELTA CHECK NOTED     REPEATED TO VERIFY     HCT  Date Value Range Status  04/05/2011 24.7* 36.0-46.0 (%) Final    Discharge Diagnoses: Term Pregnancy-delivered  Discharge Information: Date: 04/06/2011 Activity: pelvic rest Diet: routine Medications: Tylenol #3 Condition: stable Instructions: refer to practice specific booklet Discharge to: home Follow-up Information    Follow up with MARSHALL,BERNARD A, MD. Call in 6 weeks.   Contact information:   [address] [place] [address] [phone]          Newborn Data: Live born female  Birth Weight: 6 lb 8 oz (2948 g) APGAR: 8, 9  Home with mother.  MARSHALL,BERNARD A 04/06/2011, 7:05 AM

## 2012-11-06 ENCOUNTER — Inpatient Hospital Stay (HOSPITAL_COMMUNITY)
Admission: AD | Admit: 2012-11-06 | Discharge: 2012-11-06 | Disposition: A | Discharge status: Home / Self Care | Payer: Self-pay | Source: Ambulatory Visit | Attending: Obstetrics & Gynecology | Admitting: Obstetrics & Gynecology

## 2012-11-06 ENCOUNTER — Encounter (HOSPITAL_COMMUNITY): Payer: Self-pay | Admitting: *Deleted

## 2012-11-06 DIAGNOSIS — O99612 Diseases of the digestive system complicating pregnancy, second trimester: Secondary | ICD-10-CM

## 2012-11-06 DIAGNOSIS — O2390 Unspecified genitourinary tract infection in pregnancy, unspecified trimester: Secondary | ICD-10-CM | POA: Insufficient documentation

## 2012-11-06 DIAGNOSIS — A499 Bacterial infection, unspecified: Secondary | ICD-10-CM | POA: Insufficient documentation

## 2012-11-06 DIAGNOSIS — O99891 Other specified diseases and conditions complicating pregnancy: Secondary | ICD-10-CM | POA: Insufficient documentation

## 2012-11-06 DIAGNOSIS — B9689 Other specified bacterial agents as the cause of diseases classified elsewhere: Secondary | ICD-10-CM | POA: Insufficient documentation

## 2012-11-06 DIAGNOSIS — R109 Unspecified abdominal pain: Secondary | ICD-10-CM | POA: Insufficient documentation

## 2012-11-06 DIAGNOSIS — N949 Unspecified condition associated with female genital organs and menstrual cycle: Secondary | ICD-10-CM

## 2012-11-06 DIAGNOSIS — K5900 Constipation, unspecified: Secondary | ICD-10-CM | POA: Insufficient documentation

## 2012-11-06 DIAGNOSIS — N760 Acute vaginitis: Secondary | ICD-10-CM | POA: Insufficient documentation

## 2012-11-06 LAB — URINALYSIS, ROUTINE W REFLEX MICROSCOPIC
Leukocytes, UA: NEGATIVE
Nitrite: NEGATIVE
Protein, ur: NEGATIVE mg/dL
Specific Gravity, Urine: 1.02 (ref 1.005–1.030)
Urobilinogen, UA: 0.2 mg/dL (ref 0.0–1.0)

## 2012-11-06 LAB — POCT PREGNANCY, URINE: Preg Test, Ur: POSITIVE — AB

## 2012-11-06 MED ORDER — DOCUSATE SODIUM 100 MG PO CAPS: 100.0000 mg | ORAL_CAPSULE | Freq: Two times a day (BID) | ORAL | Status: DC

## 2012-11-06 MED ORDER — METRONIDAZOLE 500 MG PO TABS: 500.0000 mg | ORAL_TABLET | Freq: Two times a day (BID) | ORAL | Status: DC

## 2012-11-06 MED ORDER — POLYETHYLENE GLYCOL 3350 17 G PO PACK: 17.0000 g | PACK | Freq: Every day | ORAL | Status: DC

## 2012-11-06 NOTE — MAU Provider Note (Signed)
History     CSN: [id number]  Arrival date and time: 11/06/12 1511   First Provider Initiated Contact with Patient 11/06/12 1555      Chief Complaint  Patient presents with  . Abdominal Pain   HPI 22 y.o. G2P1001 at 15.[redacted] weeks EGA by LMP. Was unaware of pregnancy, but suspicious because she has not had a period since January. States for the last few days has been having pain in lower left side with movement. Also c/o ongoing issues with constipation. Last BM 2 days ago, difficult and painful.   Past Medical History  Diagnosis Date  . Asthma     inhaler prn    Past Surgical History  Procedure Laterality Date  . Tonsillectomy    . Umbilical hernia repair      > 10 yrs ago   . Knee dislocation surgery  2009    left    Family History  Problem Relation Age of Onset  . Anesthesia problems Neg Hx   . Hypotension Neg Hx   . Malignant hyperthermia Neg Hx   . Pseudochol deficiency Neg Hx   . Alcohol abuse Neg Hx   . Arthritis Neg Hx   . Birth defects Neg Hx   . COPD Neg Hx   . Cancer Neg Hx   . Depression Neg Hx   . Drug abuse Neg Hx   . Early death Neg Hx   . Hearing loss Neg Hx   . Heart disease Neg Hx   . Hyperlipidemia Neg Hx   . Hypertension Neg Hx   . Kidney disease Neg Hx   . Learning disabilities Neg Hx   . Mental illness Neg Hx   . Mental retardation Neg Hx   . Miscarriages / Stillbirths Neg Hx   . Stroke Neg Hx   . Vision loss Neg Hx   . Asthma Maternal Grandmother   . Diabetes Maternal Grandfather     History  Substance Use Topics  . Smoking status: Former Smoker    Quit date: 08/21/2010  . Smokeless tobacco: Never Used  . Alcohol Use: No    Allergies: No Known Allergies  Prescriptions prior to admission  Medication Sig Dispense Refill  . acetaminophen (TYLENOL) 500 MG tablet Take 500 mg by mouth every 6 (six) hours as needed for pain (headache).      Marland Kitchen ibuprofen (ADVIL,MOTRIN) 800 MG tablet Take 800 mg by mouth every 8 (eight) hours as needed  for pain (headache).        Review of Systems  Constitutional: Negative.   Respiratory: Negative.   Cardiovascular: Negative.   Gastrointestinal: Positive for abdominal pain and constipation. Negative for nausea, vomiting and diarrhea.  Genitourinary: Negative for dysuria, urgency, frequency, hematuria and flank pain.       Negative for vaginal bleeding, vaginal discharge, dyspareunia  Musculoskeletal: Negative.   Neurological: Negative.   Psychiatric/Behavioral: Negative.    Physical Exam   Blood pressure 120/63, pulse 74, temperature 98.6 F (37 C), temperature source Oral, resp. rate 18, last menstrual period 07/19/2012, not currently breastfeeding.  Physical Exam  Nursing note and vitals reviewed. Constitutional: She is oriented to person, place, and time. She appears well-developed and well-nourished. No distress.  Cardiovascular: Normal rate.   Respiratory: Effort normal.  GI: Soft. There is no tenderness.  Genitourinary: There is no rash, tenderness or lesion on the right labia. There is no rash, tenderness or lesion on the left labia. Uterus is enlarged (16 week size). Uterus is  not tender. Cervix exhibits discharge (yellow mucous). Cervix exhibits no motion tenderness and no friability. Right adnexum displays no mass, no tenderness and no fullness. Left adnexum displays no mass, no tenderness and no fullness. No erythema or bleeding around the vagina. Vaginal discharge (yellow, clear, thin) found.  Musculoskeletal: Normal range of motion.  Neurological: She is alert and oriented to person, place, and time.  Skin: Skin is warm and dry.  Psychiatric: She has a normal mood and affect.   + FHR MAU Course  Procedures  Results for orders placed during the hospital encounter of 11/06/12 (from the past 24 hour(s))  URINALYSIS, ROUTINE W REFLEX MICROSCOPIC     Status: Abnormal   Collection Time    11/06/12  3:30 PM      Result Value Range   Color, Urine YELLOW  YELLOW    APPearance CLEAR  CLEAR   Specific Gravity, Urine 1.020  1.005 - 1.030   pH 6.0  5.0 - 8.0   Glucose, UA NEGATIVE  NEGATIVE mg/dL   Hgb urine dipstick NEGATIVE  NEGATIVE   Bilirubin Urine NEGATIVE  NEGATIVE   Ketones, ur >80 (*) NEGATIVE mg/dL   Protein, ur NEGATIVE  NEGATIVE mg/dL   Urobilinogen, UA 0.2  0.0 - 1.0 mg/dL   Nitrite NEGATIVE  NEGATIVE   Leukocytes, UA NEGATIVE  NEGATIVE  POCT PREGNANCY, URINE     Status: Abnormal   Collection Time    11/06/12  3:41 PM      Result Value Range   Preg Test, Ur POSITIVE (*) NEGATIVE  WET PREP, GENITAL     Status: Abnormal   Collection Time    11/06/12  3:50 PM      Result Value Range   Yeast Wet Prep HPF POC NONE SEEN  NONE SEEN   Trich, Wet Prep NONE SEEN  NONE SEEN   Clue Cells Wet Prep HPF POC FEW (*) NONE SEEN   WBC, Wet Prep HPF POC MODERATE (*) NONE SEEN     Assessment and Plan   1. Round ligament pain   2. BV (bacterial vaginosis)   3. Constipation in pregnancy in second trimester       Medication List    TAKE these medications       acetaminophen 500 MG tablet  Commonly known as:  TYLENOL  Take 500 mg by mouth every 6 (six) hours as needed for pain (headache).     docusate sodium 100 MG capsule  Commonly known as:  COLACE  Take 1 capsule (100 mg total) by mouth 2 (two) times daily.     ibuprofen 800 MG tablet  Commonly known as:  ADVIL,MOTRIN  Take 800 mg by mouth every 8 (eight) hours as needed for pain (headache).     metroNIDAZOLE 500 MG tablet  Commonly known as:  FLAGYL  Take 1 tablet (500 mg total) by mouth 2 (two) times daily.     polyethylene glycol packet  Commonly known as:  MIRALAX / GLYCOLAX  Take 17 g by mouth daily.            Follow-up Information   Schedule an appointment as soon as possible for a visit with Kathreen Cosier, MD.   Contact information:   [address] [place] [address] [phone]         [facility] 11/06/2012, 4:44 PM

## 2012-11-06 NOTE — MAU Note (Signed)
Pt states she started having  Abdominal pains and side pains that come and go

## 2012-11-08 LAB — GC/CHLAMYDIA PROBE AMP: CT Probe RNA: POSITIVE — AB

## 2012-11-14 ENCOUNTER — Encounter (HOSPITAL_COMMUNITY): Payer: Self-pay | Admitting: *Deleted

## 2013-02-03 ENCOUNTER — Other Ambulatory Visit (HOSPITAL_COMMUNITY): Payer: Self-pay | Admitting: Physician Assistant

## 2013-02-03 DIAGNOSIS — Z3689 Encounter for other specified antenatal screening: Secondary | ICD-10-CM

## 2013-02-03 LAB — OB RESULTS CONSOLE RUBELLA ANTIBODY, IGM: Rubella: IMMUNE

## 2013-02-03 LAB — OB RESULTS CONSOLE ABO/RH: RH Type: POSITIVE

## 2013-02-03 LAB — OB RESULTS CONSOLE HEPATITIS B SURFACE ANTIGEN: Hepatitis B Surface Ag: NEGATIVE

## 2013-02-03 LAB — OB RESULTS CONSOLE ANTIBODY SCREEN: Antibody Screen: NEGATIVE

## 2013-02-05 ENCOUNTER — Ambulatory Visit (HOSPITAL_COMMUNITY)
Admission: RE | Admit: 2013-02-05 | Discharge: 2013-02-05 | Disposition: A | Discharge status: Home / Self Care | Payer: Medicaid Other | Source: Ambulatory Visit | Attending: Physician Assistant | Admitting: Physician Assistant

## 2013-02-05 ENCOUNTER — Encounter (HOSPITAL_COMMUNITY): Payer: Self-pay

## 2013-02-05 DIAGNOSIS — Z3689 Encounter for other specified antenatal screening: Secondary | ICD-10-CM

## 2013-02-05 DIAGNOSIS — O358XX0 Maternal care for other (suspected) fetal abnormality and damage, not applicable or unspecified: Secondary | ICD-10-CM | POA: Insufficient documentation

## 2013-02-05 DIAGNOSIS — Z363 Encounter for antenatal screening for malformations: Secondary | ICD-10-CM | POA: Insufficient documentation

## 2013-02-05 DIAGNOSIS — Z1389 Encounter for screening for other disorder: Secondary | ICD-10-CM | POA: Insufficient documentation

## 2013-02-07 ENCOUNTER — Other Ambulatory Visit: Payer: Self-pay | Admitting: Physician Assistant

## 2013-02-07 DIAGNOSIS — N63 Unspecified lump in unspecified breast: Secondary | ICD-10-CM

## 2013-02-12 ENCOUNTER — Ambulatory Visit
Admission: RE | Admit: 2013-02-12 | Discharge: 2013-02-12 | Disposition: A | Discharge status: Home / Self Care | Payer: Medicaid Other | Source: Ambulatory Visit | Attending: Physician Assistant | Admitting: Physician Assistant

## 2013-02-12 DIAGNOSIS — N63 Unspecified lump in unspecified breast: Secondary | ICD-10-CM

## 2013-02-28 ENCOUNTER — Encounter (HOSPITAL_COMMUNITY): Payer: Self-pay | Admitting: *Deleted

## 2013-02-28 ENCOUNTER — Inpatient Hospital Stay (HOSPITAL_COMMUNITY)
Admission: AD | Admit: 2013-02-28 | Discharge: 2013-02-28 | Disposition: A | Discharge status: Home / Self Care | Payer: Medicaid Other | Source: Ambulatory Visit | Attending: Obstetrics & Gynecology | Admitting: Obstetrics & Gynecology

## 2013-02-28 DIAGNOSIS — IMO0002 Reserved for concepts with insufficient information to code with codable children: Secondary | ICD-10-CM | POA: Insufficient documentation

## 2013-02-28 DIAGNOSIS — O99891 Other specified diseases and conditions complicating pregnancy: Secondary | ICD-10-CM | POA: Insufficient documentation

## 2013-02-28 DIAGNOSIS — O4700 False labor before 37 completed weeks of gestation, unspecified trimester: Secondary | ICD-10-CM | POA: Insufficient documentation

## 2013-02-28 DIAGNOSIS — O26899 Other specified pregnancy related conditions, unspecified trimester: Secondary | ICD-10-CM

## 2013-02-28 DIAGNOSIS — M549 Dorsalgia, unspecified: Secondary | ICD-10-CM | POA: Insufficient documentation

## 2013-02-28 DIAGNOSIS — R109 Unspecified abdominal pain: Secondary | ICD-10-CM | POA: Insufficient documentation

## 2013-02-28 DIAGNOSIS — Y92009 Unspecified place in unspecified non-institutional (private) residence as the place of occurrence of the external cause: Secondary | ICD-10-CM | POA: Insufficient documentation

## 2013-02-28 LAB — URINALYSIS, ROUTINE W REFLEX MICROSCOPIC
Glucose, UA: NEGATIVE mg/dL
Leukocytes, UA: NEGATIVE
Specific Gravity, Urine: <1.005 — ABNORMAL LOW (ref 1.005–1.030)
pH: 6 (ref 5.0–8.0)

## 2013-02-28 LAB — FETAL FIBRONECTIN: Fetal Fibronectin: NEGATIVE

## 2013-02-28 NOTE — MAU Provider Note (Signed)
Chief Complaint:  Abdominal Pain and Back Pain   First Provider Initiated Contact with Patient 02/28/13 1456      HPI: Dana Mcconnell is a 22 y.o. G2P1001 at [redacted]w[redacted]d who presents to maternity admissions reporting her 39-year-old was jumping on the bed and fell 80 on her abdomen last night. She denies abdominal pain now or contractions. No, leakage of fluid or vaginal bleeding. Good fetal movement.   Pregnancy Course: Essentially uncomplicated with care at [facility]  Past Medical History: Past Medical History  Diagnosis Date  . Asthma     inhaler prn  . Chlamydia     Past obstetric history: OB History  Gravida Para Term Preterm AB SAB TAB Ectopic Multiple Living  2 1 1  0 0 0 0 0 0 1    # Outcome Date GA Lbr Len/2nd Weight Sex Delivery Anes PTL Lv  2 CUR           1 TRM 04/04/11 [redacted]w[redacted]d 05:55 / 01:07 6 lb 8 oz (2.948 kg) F SVD EPI  Y      Past Surgical History: Past Surgical History  Procedure Laterality Date  . Tonsillectomy    . Umbilical hernia repair      > 10 yrs ago   . Knee dislocation surgery  2009    left     Family History:   Social History: History  Substance Use Topics  . Smoking status: Former Smoker    Quit date: 08/21/2010  . Smokeless tobacco: Never Used  . Alcohol Use: No    Allergies: No Known Allergies  Meds:  Prescriptions prior to admission  Medication Sig Dispense Refill  . acetaminophen (TYLENOL) 500 MG tablet Take 500 mg by mouth every 6 (six) hours as needed for pain.       . Prenatal Vit-Fe Fumarate-FA (PRENATAL MULTIVITAMIN) TABS tablet Take 1 tablet by mouth daily at 12 noon.        ROS: Pertinent findings in history of present illness.  Physical Exam  Blood pressure 113/68, pulse 83, temperature 98.2 F (36.8 C), temperature source Oral, resp. rate 16, height 5\' 5"  (1.651 m), weight 167 lb 3.2 oz (75.841 kg), last menstrual period 07/19/2012. GENERAL: Well-developed, well-nourished female in no acute distress.  HEENT:  normocephalic HEART: normal rate RESP: normal effort ABDOMEN: Soft, non-tender, gravid appropriate for gestational age EXTREMITIES: Nontender, no edema NEURO: alert and oriented SPECULUM EXAM: NEFG, physiologic discharge, no blood, cervix clean Dilation: Closed (? FT int) Effacement (%): Thick Cervical Position: Posterior Station: -3 Exam by:: D. Aaban Griep, CNM Ext 1, int closed, long, high  FHT:  Baseline 140 , moderate variability, accelerations present, no decelerations Contractions: q 2-4 mins> decreased to mild irregular with po fluids   Labs: Results for orders placed during the hospital encounter of 02/28/13 (from the past 24 hour(s))  URINALYSIS, ROUTINE W REFLEX MICROSCOPIC     Status: Abnormal   Collection Time    02/28/13  2:05 PM      Result Value Range   Color, Urine YELLOW  YELLOW   APPearance CLEAR  CLEAR   Specific Gravity, Urine <1.005 (*) 1.005 - 1.030   pH 6.0  5.0 - 8.0   Glucose, UA NEGATIVE  NEGATIVE mg/dL   Hgb urine dipstick NEGATIVE  NEGATIVE   Bilirubin Urine NEGATIVE  NEGATIVE   Ketones, ur NEGATIVE  NEGATIVE mg/dL   Protein, ur NEGATIVE  NEGATIVE mg/dL   Urobilinogen, UA 0.2  0.0 - 1.0 mg/dL   Nitrite  NEGATIVE  NEGATIVE   Leukocytes, UA NEGATIVE  NEGATIVE  FETAL FIBRONECTIN     Status: None   Collection Time    02/28/13  3:05 PM      Result Value Range   Fetal Fibronectin NEGATIVE  NEGATIVE    MAU Course: EFM x 2hrs, then had to leave to get child  Assessment: 1. Abdominal pain complicating pregnancy   (Pain resolved) Category 1 FHR  Plan: Discharge home Fluids and rest Labor precautions     Medication List         acetaminophen 500 MG tablet  Commonly known as:  TYLENOL  Take 500 mg by mouth every 6 (six) hours as needed for pain.     prenatal multivitamin Tabs tablet  Take 1 tablet by mouth daily at 12 noon.       Follow-up Information   Follow up with [facility] [place] On 03/03/2013.   Contact information:   [address] [place] [address] [phone]       [name], CNM 02/28/2013 3:11 PM

## 2013-02-28 NOTE — MAU Note (Signed)
Pt states her daughter jumped on her abdomen yesterday, she has been having abd & back pain ever since.  Denies bleeding or LOF.  Decreased FM.

## 2013-03-03 ENCOUNTER — Encounter (HOSPITAL_COMMUNITY): Payer: Self-pay | Admitting: *Deleted

## 2013-03-03 ENCOUNTER — Inpatient Hospital Stay (HOSPITAL_COMMUNITY)
Admission: AD | Admit: 2013-03-03 | Discharge: 2013-03-03 | Disposition: A | Discharge status: Home / Self Care | Payer: Medicaid Other | Source: Ambulatory Visit | Attending: Obstetrics and Gynecology | Admitting: Obstetrics and Gynecology

## 2013-03-03 ENCOUNTER — Inpatient Hospital Stay (HOSPITAL_COMMUNITY): Payer: Medicaid Other

## 2013-03-03 DIAGNOSIS — O9A212 Injury, poisoning and certain other consequences of external causes complicating pregnancy, second trimester: Secondary | ICD-10-CM

## 2013-03-03 DIAGNOSIS — O4700 False labor before 37 completed weeks of gestation, unspecified trimester: Secondary | ICD-10-CM | POA: Insufficient documentation

## 2013-03-03 DIAGNOSIS — O479 False labor, unspecified: Secondary | ICD-10-CM

## 2013-03-03 LAB — WET PREP, GENITAL
Clue Cells Wet Prep HPF POC: NONE SEEN
Trich, Wet Prep: NONE SEEN
Yeast Wet Prep HPF POC: NONE SEEN

## 2013-03-03 MED ORDER — LACTATED RINGERS IV SOLN
INTRAVENOUS | Status: DC
  Administered 2013-03-03 (×2): via INTRAVENOUS

## 2013-03-03 MED ORDER — BETAMETHASONE SOD PHOS & ACET 6 (3-3) MG/ML IJ SUSP
12.0000 mg | INTRAMUSCULAR | Status: AC
  Administered 2013-03-03: 12 mg via INTRAMUSCULAR
  Filled 2013-03-03: qty 2

## 2013-03-03 MED ORDER — LACTATED RINGERS IV BOLUS (SEPSIS)
1000.0000 mL | Freq: Once | INTRAVENOUS | Status: AC
  Administered 2013-03-03: 1000 mL via INTRAVENOUS

## 2013-03-03 MED ORDER — FENTANYL CITRATE 0.05 MG/ML IJ SOLN
100.0000 ug | Freq: Once | INTRAMUSCULAR | Status: AC
  Administered 2013-03-03: 100 ug via INTRAVENOUS
  Filled 2013-03-03: qty 2

## 2013-03-03 NOTE — MAU Note (Signed)
Pt has received 2 liters of luid and has not been up to BR yet;

## 2013-03-03 NOTE — MAU Provider Note (Signed)
Chief Complaint:  Labor Eval   None     HPI: Dana Mcconnell is a 22 y.o. G2P1001 at 76w3dwho presents to maternity admissions reporting cramping/abdominal pain that is intermittent and sharp off and on since Friday, when her 16 year old child jumped onto her abdomen.  Today this pain has been more severe.  She reports good fetal movement, denies LOF, vaginal bleeding, vaginal itching/burning, urinary symptoms, h/a, dizziness, n/v, or fever/chills.  Pt had negative FFN on Friday in MAU, day of initial injury.   Past Medical History: Past Medical History  Diagnosis Date  . Asthma     inhaler prn  . Chlamydia     Past obstetric history: OB History  Gravida Para Term Preterm AB SAB TAB Ectopic Multiple Living  2 1 1  0 0 0 0 0 0 1    # Outcome Date GA Lbr Len/2nd Weight Sex Delivery Anes PTL Lv  2 CUR           1 TRM 04/04/11 [redacted]w[redacted]d 05:55 / 01:07 6 lb 8 oz (2.948 kg) F SVD EPI  Y      Past Surgical History: Past Surgical History  Procedure Laterality Date  . Tonsillectomy    . Umbilical hernia repair      > 10 yrs ago   . Knee dislocation surgery  2009    left    Family History: Family History  Problem Relation Age of Onset  . Anesthesia problems Neg Hx   . Hypotension Neg Hx   . Malignant hyperthermia Neg Hx   . Pseudochol deficiency Neg Hx   . Alcohol abuse Neg Hx   . Arthritis Neg Hx   . Birth defects Neg Hx   . COPD Neg Hx   . Cancer Neg Hx   . Depression Neg Hx   . Drug abuse Neg Hx   . Early death Neg Hx   . Hearing loss Neg Hx   . Heart disease Neg Hx   . Hyperlipidemia Neg Hx   . Hypertension Neg Hx   . Kidney disease Neg Hx   . Learning disabilities Neg Hx   . Mental illness Neg Hx   . Mental retardation Neg Hx   . Miscarriages / Stillbirths Neg Hx   . Stroke Neg Hx   . Vision loss Neg Hx   . Asthma Maternal Grandmother   . Diabetes Maternal Grandfather     Social History: History  Substance Use Topics  . Smoking status: Former Smoker   Quit date: 08/21/2010  . Smokeless tobacco: Never Used  . Alcohol Use: No    Allergies: No Known Allergies  Meds:  Prescriptions prior to admission  Medication Sig Dispense Refill  . albuterol (PROVENTIL HFA;VENTOLIN HFA) 108 (90 BASE) MCG/ACT inhaler Inhale 2 puffs into the lungs every 6 (six) hours as needed for wheezing or shortness of breath.      . Prenatal Vit-Fe Fumarate-FA (PRENATAL MULTIVITAMIN) TABS tablet Take 1 tablet by mouth daily at 12 noon.        ROS: Pertinent findings in history of present illness.  Physical Exam  Blood pressure 120/71, pulse 102, temperature 97.4 F (36.3 C), resp. rate 16, last menstrual period 07/19/2012. GENERAL: Well-developed, well-nourished female in no acute distress.  HEENT: normocephalic HEART: normal rate RESP: normal effort ABDOMEN: Soft, non-tender, gravid appropriate for gestational age EXTREMITIES: Nontender, no edema NEURO: alert and oriented Pelvic exam: Cervix pink, visually closed, without lesion, scant white creamy discharge, vaginal walls  and external genitalia normal Bimanual exam: Cervix 0/long/high, firm, anterior, neg CMT, uterus nontender, nonenlarged, adnexa without tenderness, enlargement, or mass Dilation: 1 Effacement (%): Thick Cervical Position: Posterior Station: Ballotable Exam by:: L Leftwich-Kirby CNM   FHT:  Baseline 135 , moderate variability, accelerations present, no decelerations Contractions: Upon arrival in MAU, q 1-3 mins, mild to palpation Prior to discharge, irregular, 1-10 minutes, mild to palpation   Labs: Results for orders placed during the hospital encounter of 03/03/13 (from the past 168 hour(s))  WET PREP, GENITAL   Collection Time    03/03/13  7:25 PM      Result Value Range   Yeast Wet Prep HPF POC NONE SEEN  NONE SEEN   Trich, Wet Prep NONE SEEN  NONE SEEN   Clue Cells Wet Prep HPF POC NONE SEEN  NONE SEEN   WBC, Wet Prep HPF POC FEW (*) NONE SEEN     Imaging:      Assessment: 1. Threatened preterm labor, antepartum, third trimester   2. Traumatic injury during pregnancy, second trimester     Plan: Consult with Dr Jolayne Panther Discharge home PTL precautions and fetal kick counts F/U in clinic Return to MAU as needed    Medication List    ASK your doctor about these medications       albuterol 108 (90 BASE) MCG/ACT inhaler  Commonly known as:  PROVENTIL HFA;VENTOLIN HFA  Inhale 2 puffs into the lungs every 6 (six) hours as needed for wheezing or shortness of breath.     prenatal multivitamin Tabs tablet  Take 1 tablet by mouth daily at 12 noon.        Sharen Counter Certified Nurse-Midwife 03/03/2013 6:29 PM

## 2013-03-03 NOTE — MAU Note (Signed)
C/o ucs since 1430 (after doctor's visit);

## 2013-03-06 ENCOUNTER — Inpatient Hospital Stay (HOSPITAL_COMMUNITY)
Admission: AD | Admit: 2013-03-06 | Discharge: 2013-03-06 | Disposition: A | Discharge status: Home / Self Care | Payer: Medicaid Other | Source: Ambulatory Visit | Attending: Obstetrics & Gynecology | Admitting: Obstetrics & Gynecology

## 2013-03-06 DIAGNOSIS — O4700 False labor before 37 completed weeks of gestation, unspecified trimester: Secondary | ICD-10-CM | POA: Insufficient documentation

## 2013-03-06 MED ORDER — BETAMETHASONE SOD PHOS & ACET 6 (3-3) MG/ML IJ SUSP
12.0000 mg | Freq: Once | INTRAMUSCULAR | Status: AC
  Administered 2013-03-06: 12 mg via INTRAMUSCULAR
  Filled 2013-03-06: qty 2

## 2013-03-12 ENCOUNTER — Inpatient Hospital Stay (HOSPITAL_COMMUNITY): Payer: Medicaid Other

## 2013-03-12 ENCOUNTER — Other Ambulatory Visit: Payer: Self-pay

## 2013-03-12 ENCOUNTER — Encounter (HOSPITAL_COMMUNITY): Payer: Self-pay | Admitting: Family

## 2013-03-12 ENCOUNTER — Inpatient Hospital Stay (HOSPITAL_COMMUNITY)
Admission: AD | Admit: 2013-03-12 | Discharge: 2013-03-12 | Disposition: A | Discharge status: Home / Self Care | Payer: Medicaid Other | Source: Ambulatory Visit | Attending: Obstetrics & Gynecology | Admitting: Obstetrics & Gynecology

## 2013-03-12 DIAGNOSIS — E46 Unspecified protein-calorie malnutrition: Secondary | ICD-10-CM | POA: Insufficient documentation

## 2013-03-12 DIAGNOSIS — M549 Dorsalgia, unspecified: Secondary | ICD-10-CM | POA: Insufficient documentation

## 2013-03-12 DIAGNOSIS — O99891 Other specified diseases and conditions complicating pregnancy: Secondary | ICD-10-CM | POA: Insufficient documentation

## 2013-03-12 DIAGNOSIS — O4700 False labor before 37 completed weeks of gestation, unspecified trimester: Secondary | ICD-10-CM | POA: Insufficient documentation

## 2013-03-12 DIAGNOSIS — E860 Dehydration: Secondary | ICD-10-CM

## 2013-03-12 DIAGNOSIS — R51 Headache: Secondary | ICD-10-CM | POA: Insufficient documentation

## 2013-03-12 LAB — CBC
Hemoglobin: 10 g/dL — ABNORMAL LOW (ref 12.0–15.0)
MCV: 88.8 fL (ref 78.0–100.0)
Platelets: 182 10*3/uL (ref 150–400)
RBC: 3.21 MIL/uL — ABNORMAL LOW (ref 3.87–5.11)
WBC: 6.5 10*3/uL (ref 4.0–10.5)

## 2013-03-12 LAB — COMPREHENSIVE METABOLIC PANEL
Albumin: 2.5 g/dL — ABNORMAL LOW (ref 3.5–5.2)
BUN: 4 mg/dL — ABNORMAL LOW (ref 6–23)
Calcium: 8.8 mg/dL (ref 8.4–10.5)
Chloride: 101 mEq/L (ref 96–112)
Creatinine, Ser: 0.61 mg/dL (ref 0.50–1.10)
Total Bilirubin: 0.3 mg/dL (ref 0.3–1.2)
Total Protein: 5.9 g/dL — ABNORMAL LOW (ref 6.0–8.3)

## 2013-03-12 MED ORDER — LACTATED RINGERS IV SOLN
INTRAVENOUS | Status: DC
  Administered 2013-03-12: 10:00:00 via INTRAVENOUS

## 2013-03-12 MED ORDER — PROMETHAZINE HCL 25 MG/ML IJ SOLN
25.0000 mg | Freq: Once | INTRAMUSCULAR | Status: AC
  Administered 2013-03-12: 25 mg via INTRAVENOUS
  Filled 2013-03-12: qty 1

## 2013-03-12 MED ORDER — ACETAMINOPHEN 500 MG PO TABS: 500.0000 mg | ORAL_TABLET | Freq: Four times a day (QID) | ORAL | Status: DC | PRN

## 2013-03-12 NOTE — MAU Note (Signed)
22 yo, G2P1 at [redacted]w[redacted]d, presents to MAU with c/o contractions and lower back pain since 2300 yesterday. Denies VB, LOF. Reports +FM.

## 2013-03-12 NOTE — MAU Provider Note (Signed)
Attestation of Attending Supervision of Advanced Practitioner (CNM/NP): Evaluation and management procedures were performed by the Advanced Practitioner under my supervision and collaboration.  I have reviewed the Advanced Practitioner's note and chart, and I agree with the management and plan.  Dana Mcconnell 03/12/2013 10:27 AM

## 2013-03-12 NOTE — MAU Note (Signed)
Started contracting last night. No bleeding, no leaking

## 2013-03-12 NOTE — MAU Provider Note (Signed)
History     CSN: [id number]  Arrival date and time: 03/12/13 4098   First Provider Initiated Contact with Patient 03/12/13 1025      Chief Complaint  Patient presents with  . Back Pain  . Contractions   HPI Comments: Dana Mcconnell is a 22 y.o. G2P1001 at [redacted]w[redacted]d who presents with painful contractions happening ever 3-5 minutes since 2300 last night. She has had contractions since last week, but only recently became extremely painful. She reports no LOF, no vaginal bleeding, and decreased fetal movement. She says she stays hydrated. She reports that her daughter jumped on her belly 2 weeks ago. She was seen at the MAU on 9/15 after that incident. She also has had one episode of loss of consciousness in April, while in [place], and had a near syncope episode during her current MAU visit.   OB History   Grav Para Term Preterm Abortions TAB SAB Ect Mult Living   2 1 1  0 0 0 0 0 0 1      Past Medical History  Diagnosis Date  . Asthma     inhaler prn  . Chlamydia     Past Surgical History  Procedure Laterality Date  . Tonsillectomy    . Umbilical hernia repair      > 10 yrs ago   . Knee dislocation surgery  2009    left    Family History  Problem Relation Age of Onset  . Anesthesia problems Neg Hx   . Hypotension Neg Hx   . Malignant hyperthermia Neg Hx   . Pseudochol deficiency Neg Hx   . Alcohol abuse Neg Hx   . Arthritis Neg Hx   . Birth defects Neg Hx   . COPD Neg Hx   . Cancer Neg Hx   . Depression Neg Hx   . Drug abuse Neg Hx   . Early death Neg Hx   . Hearing loss Neg Hx   . Heart disease Neg Hx   . Hyperlipidemia Neg Hx   . Hypertension Neg Hx   . Kidney disease Neg Hx   . Learning disabilities Neg Hx   . Mental illness Neg Hx   . Mental retardation Neg Hx   . Miscarriages / Stillbirths Neg Hx   . Stroke Neg Hx   . Vision loss Neg Hx   . Asthma Maternal Grandmother   . Diabetes Maternal Grandfather     History  Substance Use Topics  .  Smoking status: Former Smoker    Quit date: 08/21/2010  . Smokeless tobacco: Never Used  . Alcohol Use: No    Allergies: No Known Allergies  Prescriptions prior to admission  Medication Sig Dispense Refill  . albuterol (PROVENTIL HFA;VENTOLIN HFA) 108 (90 BASE) MCG/ACT inhaler Inhale 2 puffs into the lungs every 6 (six) hours as needed for wheezing or shortness of breath.      . Prenatal Vit-Fe Fumarate-FA (PRENATAL MULTIVITAMIN) TABS tablet Take 1 tablet by mouth daily at 12 noon.        Review of Systems  Constitutional: Negative for fever.  Eyes: Negative for blurred vision.  Gastrointestinal: Positive for nausea and abdominal pain. Negative for vomiting.  Genitourinary: Negative for dysuria.  Musculoskeletal: Positive for back pain.  Neurological: Positive for dizziness and headaches. Negative for loss of consciousness.   Physical Exam   Height 5\' 4"  (1.626 m), weight 75.751 kg (167 lb), last menstrual period 07/19/2012.  Physical Exam  Constitutional: She is oriented  to person, place, and time. She appears well-developed and well-nourished. She appears distressed.  HENT:  Oropharynx dry. Lips dry and slightly cracked.  Cardiovascular: Normal rate, regular rhythm, normal heart sounds and intact distal pulses.   Respiratory: Breath sounds normal. Tachypnea noted.  GI: Soft. There is tenderness.  Genitourinary: No vaginal discharge found.  Musculoskeletal: Normal range of motion. She exhibits edema. She exhibits no tenderness.  Neurological: She is alert and oriented to person, place, and time.  Skin: Skin is warm and dry. She is not diaphoretic.   Dilation: 1 Effacement (%): Thick Exam by:: per Dr Mal Misty and D. Jelisa  cnm  MAU Course  Procedures  MDM - IV fluids - US showed no abnormalities - EKG showed rare PVCs  CMP     Component Value Date/Time   NA 135 03/12/2013 1039   K 3.1* 03/12/2013 1039   CL 101 03/12/2013 1039   CO2 21 03/12/2013 1039   GLUCOSE 82  03/12/2013 1039   BUN 4* 03/12/2013 1039   CREATININE 0.61 03/12/2013 1039   CALCIUM 8.8 03/12/2013 1039   PROT 5.9* 03/12/2013 1039   ALBUMIN 2.5* 03/12/2013 1039   AST 14 03/12/2013 1039   ALT 9 03/12/2013 1039   ALKPHOS 116 03/12/2013 1039   BILITOT 0.3 03/12/2013 1039   GFRNONAA >90 03/12/2013 1039   GFRAA >90 03/12/2013 1039    CBC    Component Value Date/Time   WBC 6.5 03/12/2013 1039   RBC 3.21* 03/12/2013 1039   HGB 10.0* 03/12/2013 1039   HCT 28.5* 03/12/2013 1039   PLT 182 03/12/2013 1039   MCV 88.8 03/12/2013 1039   MCH 31.2 03/12/2013 1039   MCHC 35.1 03/12/2013 1039   RDW 12.0 03/12/2013 1039   LYMPHSABS 1.1 07/12/2009 1150   MONOABS 0.4 07/12/2009 1150   EOSABS 0.1 07/12/2009 1150   BASOSABS 0.0 07/12/2009 1150    Assessment and Plan  Dana Mcconnell is a 22 y.o. G2P1001 at [redacted]w[redacted]d who presents with dehydration and malnutrition  #Dehydration/malnutrition - physical exam suggests dehydration. Symptoms improved with fluid resuscitation. Albumin and total protein low. Social situation was assessed by nurse and patient states no problems socially.  - Counseled Dana Mcconnell on importance of keeping hydrated and eating regularly. - Labor precautions given - instructed to keep regular scheduled prenatal care appointment  #Headache - most likely related to patient not eating regularly - prescribed tylenol for headache  Dana Mcconnell 03/12/2013, 10:26 AM  Evaluation and management procedures were performed by Resident physician under my supervision/collaboration. Chart reviewed, patient examined by me and I agree with management and plan.

## 2013-04-02 LAB — OB RESULTS CONSOLE GBS: GBS: NEGATIVE

## 2013-04-03 LAB — OB RESULTS CONSOLE GC/CHLAMYDIA: Chlamydia: NEGATIVE

## 2013-04-28 ENCOUNTER — Other Ambulatory Visit (HOSPITAL_COMMUNITY): Payer: Self-pay | Admitting: Nurse Practitioner

## 2013-04-28 DIAGNOSIS — O480 Post-term pregnancy: Secondary | ICD-10-CM

## 2013-04-29 ENCOUNTER — Telehealth (HOSPITAL_COMMUNITY): Payer: Self-pay | Admitting: *Deleted

## 2013-04-29 NOTE — Telephone Encounter (Signed)
Preadmission screen  

## 2013-04-30 ENCOUNTER — Ambulatory Visit (HOSPITAL_COMMUNITY)
Admission: RE | Admit: 2013-04-30 | Discharge: 2013-04-30 | Disposition: A | Discharge status: Home / Self Care | Payer: Medicaid Other | Source: Ambulatory Visit | Attending: Nurse Practitioner | Admitting: Nurse Practitioner

## 2013-04-30 ENCOUNTER — Other Ambulatory Visit (HOSPITAL_COMMUNITY): Payer: Self-pay | Admitting: Nurse Practitioner

## 2013-04-30 ENCOUNTER — Ambulatory Visit (HOSPITAL_COMMUNITY): Admission: RE | Admit: 2013-04-30 | Payer: Medicaid Other | Source: Ambulatory Visit

## 2013-04-30 DIAGNOSIS — O480 Post-term pregnancy: Secondary | ICD-10-CM | POA: Insufficient documentation

## 2013-04-30 DIAGNOSIS — Z3689 Encounter for other specified antenatal screening: Secondary | ICD-10-CM | POA: Insufficient documentation

## 2013-05-01 ENCOUNTER — Encounter (HOSPITAL_COMMUNITY): Payer: Medicaid Other | Admitting: Anesthesiology

## 2013-05-01 ENCOUNTER — Inpatient Hospital Stay (HOSPITAL_COMMUNITY)
Admission: AD | Admit: 2013-05-01 | Discharge: 2013-05-04 | DRG: 766 | Disposition: A | Discharge status: Home / Self Care | Payer: Medicaid Other | Source: Ambulatory Visit | Attending: Obstetrics & Gynecology | Admitting: Obstetrics & Gynecology

## 2013-05-01 ENCOUNTER — Encounter (HOSPITAL_COMMUNITY): Payer: Self-pay | Admitting: *Deleted

## 2013-05-01 ENCOUNTER — Inpatient Hospital Stay (HOSPITAL_COMMUNITY): Payer: Medicaid Other | Admitting: Anesthesiology

## 2013-05-01 DIAGNOSIS — O0930 Supervision of pregnancy with insufficient antenatal care, unspecified trimester: Secondary | ICD-10-CM

## 2013-05-01 DIAGNOSIS — Z3493 Encounter for supervision of normal pregnancy, unspecified, third trimester: Secondary | ICD-10-CM

## 2013-05-01 DIAGNOSIS — O324XX0 Maternal care for high head at term, not applicable or unspecified: Secondary | ICD-10-CM | POA: Diagnosis present

## 2013-05-01 DIAGNOSIS — Z87891 Personal history of nicotine dependence: Secondary | ICD-10-CM

## 2013-05-01 LAB — CBC
MCH: 30.7 pg (ref 26.0–34.0)
MCV: 88.5 fL (ref 78.0–100.0)
Platelets: 142 10*3/uL — ABNORMAL LOW (ref 150–400)
RBC: 3.39 MIL/uL — ABNORMAL LOW (ref 3.87–5.11)

## 2013-05-01 LAB — RPR: RPR Ser Ql: NONREACTIVE

## 2013-05-01 MED ORDER — LACTATED RINGERS IV SOLN
INTRAVENOUS | Status: DC
  Administered 2013-05-01 – 2013-05-02 (×3): via INTRAVENOUS

## 2013-05-01 MED ORDER — OXYCODONE-ACETAMINOPHEN 5-325 MG PO TABS: 1.0000 | ORAL_TABLET | ORAL | Status: DC | PRN

## 2013-05-01 MED ORDER — FENTANYL CITRATE 0.05 MG/ML IJ SOLN: 100.0000 ug | INTRAMUSCULAR | Status: DC | PRN

## 2013-05-01 MED ORDER — CITRIC ACID-SODIUM CITRATE 334-500 MG/5ML PO SOLN
30.0000 mL | ORAL | Status: DC | PRN
  Administered 2013-05-02: 30 mL via ORAL
  Filled 2013-05-01: qty 15

## 2013-05-01 MED ORDER — EPHEDRINE 5 MG/ML INJ
10.0000 mg | INTRAVENOUS | Status: DC | PRN
  Filled 2013-05-01: qty 4

## 2013-05-01 MED ORDER — OXYTOCIN 40 UNITS IN LACTATED RINGERS INFUSION - SIMPLE MED
1.0000 m[IU]/min | INTRAVENOUS | Status: DC
  Administered 2013-05-01: 2 m[IU]/min via INTRAVENOUS

## 2013-05-01 MED ORDER — EPHEDRINE 5 MG/ML INJ: 10.0000 mg | INTRAVENOUS | Status: DC | PRN

## 2013-05-01 MED ORDER — ACETAMINOPHEN 325 MG PO TABS: 650.0000 mg | ORAL_TABLET | ORAL | Status: DC | PRN

## 2013-05-01 MED ORDER — OXYTOCIN 40 UNITS IN LACTATED RINGERS INFUSION - SIMPLE MED
62.5000 mL/h | INTRAVENOUS | Status: DC
  Filled 2013-05-01: qty 1000

## 2013-05-01 MED ORDER — PHENYLEPHRINE 40 MCG/ML (10ML) SYRINGE FOR IV PUSH (FOR BLOOD PRESSURE SUPPORT)
80.0000 ug | PREFILLED_SYRINGE | INTRAVENOUS | Status: DC | PRN
  Filled 2013-05-01: qty 10

## 2013-05-01 MED ORDER — SODIUM BICARBONATE 8.4 % IV SOLN
INTRAVENOUS | Status: DC | PRN
  Administered 2013-05-01 – 2013-05-02 (×2): 5 mL via EPIDURAL
  Administered 2013-05-02: 4 mL via EPIDURAL
  Administered 2013-05-02: 5 mL via EPIDURAL

## 2013-05-01 MED ORDER — ONDANSETRON HCL 4 MG/2ML IJ SOLN: 4.0000 mg | Freq: Four times a day (QID) | INTRAMUSCULAR | Status: DC | PRN

## 2013-05-01 MED ORDER — LACTATED RINGERS IV SOLN: 500.0000 mL | Freq: Once | INTRAVENOUS | Status: DC

## 2013-05-01 MED ORDER — LIDOCAINE HCL (PF) 1 % IJ SOLN
30.0000 mL | INTRAMUSCULAR | Status: DC | PRN
  Filled 2013-05-01: qty 30

## 2013-05-01 MED ORDER — OXYTOCIN BOLUS FROM INFUSION: 500.0000 mL | INTRAVENOUS | Status: DC

## 2013-05-01 MED ORDER — DIPHENHYDRAMINE HCL 50 MG/ML IJ SOLN: 12.5000 mg | INTRAMUSCULAR | Status: DC | PRN

## 2013-05-01 MED ORDER — ZOLPIDEM TARTRATE 5 MG PO TABS: 5.0000 mg | ORAL_TABLET | Freq: Every evening | ORAL | Status: DC | PRN

## 2013-05-01 MED ORDER — TERBUTALINE SULFATE 1 MG/ML IJ SOLN: 0.2500 mg | Freq: Once | INTRAMUSCULAR | Status: AC | PRN

## 2013-05-01 MED ORDER — LACTATED RINGERS IV SOLN
500.0000 mL | INTRAVENOUS | Status: DC | PRN
  Administered 2013-05-01: 500 mL via INTRAVENOUS

## 2013-05-01 MED ORDER — IBUPROFEN 600 MG PO TABS: 600.0000 mg | ORAL_TABLET | Freq: Four times a day (QID) | ORAL | Status: DC | PRN

## 2013-05-01 MED ORDER — FENTANYL 2.5 MCG/ML BUPIVACAINE 1/10 % EPIDURAL INFUSION (WH - ANES)
14.0000 mL/h | INTRAMUSCULAR | Status: DC | PRN
  Administered 2013-05-01 (×2): 14 mL/h via EPIDURAL
  Filled 2013-05-01 (×2): qty 125

## 2013-05-01 MED ORDER — PHENYLEPHRINE 40 MCG/ML (10ML) SYRINGE FOR IV PUSH (FOR BLOOD PRESSURE SUPPORT): 80.0000 ug | PREFILLED_SYRINGE | INTRAVENOUS | Status: DC | PRN

## 2013-05-01 NOTE — Progress Notes (Addendum)
Dana Mcconnell is a 22 y.o. G2P1001 at [redacted]w[redacted]d admitted for active labor  Subjective: Pt now s/p epidural. Pain well controlled.   Objective: BP 112/69  Pulse 82  Temp(Src) 98.2 F (36.8 C)  Resp 18  Ht 5\' 5"  (1.651 m)  Wt 83.008 kg (183 lb)  BMI 30.45 kg/m2  SpO2 100%  LMP 07/19/2012      FHT:  FHR: 120s bpm, variability: moderate,  accelerations:  Abscent,  decelerations:  Present prolonged decel to 80s for 5 mins after epidrual and foley placement. Pt slowly improved back to 120s. no accels yet since decel UC:   regular, every 3 minutes SVE:   Dilation: 5 Effacement (%): 30 Station: +3 Exam by:: Dr Ike Bene  Labs: Lab Results  Component Value Date   WBC 7.4 05/01/2013   HGB 10.4* 05/01/2013   HCT 30.0* 05/01/2013   MCV 88.5 05/01/2013   PLT 142* 05/01/2013    Assessment / Plan: Spontaneous labor, progressing normally  Labor: Progressing normally Preeclampsia:  no signs or symptoms of toxicity Fetal Wellbeing:  Category II improved with fluid bolus and repositioning.  Pain Control:  Epidural I/D:  n/a Anticipated MOD:  NSVD  Dana Mcconnell 05/01/2013, 1:50 PM

## 2013-05-01 NOTE — MAU Note (Signed)
Patient states she is having contractions every 5 minutes. Denies bleeding or leaking.

## 2013-05-01 NOTE — MAU Note (Signed)
C/o ucs since @ [phone]; had a regular OB appointment today and she started hurting on the way; her OB office advised her to come to MAU; 3-4 days ago pt's cervix was 1 cm;

## 2013-05-01 NOTE — Progress Notes (Signed)
Contractions inadequate, FHR now Cat1.  Dr. Debroah Loop reviewed strip.  Will augment with pitocin.

## 2013-05-01 NOTE — Progress Notes (Addendum)
LABOR PROGRESS NOTE  Dana Mcconnell is a 22 y.o. G2P1001 at [redacted]w[redacted]d  admitted for active labor  Subjective: Lying on side, no complaints at this time Family members at bedside and supportive  Objective: BP 114/75  Pulse 98  Temp(Src) 98.1 F (36.7 C) (Oral)  Resp 18  Ht 5\' 5"  (1.651 m)  Wt 83.008 kg (183 lb)  BMI 30.45 kg/m2  SpO2 100%  LMP 07/19/2012 Total I/O In: -  Out: 400 [Urine:400]  FHT:  FHR: 130 bpm, variability: moderate,  accelerations:  Present,  decelerations:  Present lates UC:   q2 mins SVE:   8-9 with very thick lip from 12-6  Labs: Lab Results  Component Value Date   WBC 7.4 05/01/2013   HGB 10.4* 05/01/2013   HCT 30.0* 05/01/2013   MCV 88.5 05/01/2013   PLT 142* 05/01/2013    Assessment / Plan: Spontaneous labor, making progress but still with thick lip  Labor: s/p AROM s/p IUPC  Monitor for continued cervical progression and adequate contraction pattern Fetal Wellbeing:  Category II, placing o2 for continued late decels and positional changes, will monitor Pain Control:  Epidural Anticipated MOD:  NSVD  Anselm Lis, MD 05/01/2013, 9:18 PM

## 2013-05-01 NOTE — H&P (Signed)
Dana Mcconnell is a 22 y.o. female G2P1001 with IUP at [redacted]w[redacted]d by LMP consistent with 29 wk ultrasound presenting for contractions. Pt reports onset of contractions this morning. They are now increasing in intensity and frequency. They are currently occurring every 4 mins. She denies LOF and vaginal bleeding. She reports normal fetal movement.   Pt's pregnancy has been complicated by late prenatal care (29 wks), history of asthma (uses Albuterol inhaler PRN 2-3 times per week) and a breast mass that will require repeat right breast ultrasound in 6 months.   [facility]are at [facility] department since 29 wks. Her 1 hr GTT was normal. She presented to [facility] too late for genetic screening. 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 Pertinent genetic history includes FOB with a first degree relative with down syndrome. She declined genetic counseling. She is GBS negative.   Past Medical History: Past Medical History  Diagnosis Date  . Asthma     inhaler prn  . Chlamydia     Past Surgical History: Past Surgical History  Procedure Laterality Date  . Tonsillectomy    . Umbilical hernia repair      > 10 yrs ago   . Knee dislocation surgery  2009    left    Obstetrical History: OB History   Grav Para Term Preterm Abortions TAB SAB Ect Mult Living   2 1 1  0 0 0 0 0 0 1      Gynecological History: OB History   Grav Para Term Preterm Abortions TAB SAB Ect Mult Living   2 1 1  0 0 0 0 0 0 1      Social History: History   Social History  . Marital Status: Single    Spouse Name: N/A    Number of Children: N/A  . Years of Education: N/A   Social History Main Topics  . Smoking status: Former Smoker    Quit date: 08/21/2010  . Smokeless tobacco: Never Used  . Alcohol Use: No  . Drug Use: Yes    Special: Marijuana     Comment: early preg.  Marland Kitchen Sexual Activity: Yes   Other Topics Concern  . None   Social History  Narrative  . None    Family History: Family History  Problem Relation Age of Onset  . Anesthesia problems Neg Hx   . Hypotension Neg Hx   . Malignant hyperthermia Neg Hx   . Pseudochol deficiency Neg Hx   . Alcohol abuse Neg Hx   . Arthritis Neg Hx   . Birth defects Neg Hx   . COPD Neg Hx   . Cancer Neg Hx   . Depression Neg Hx   . Drug abuse Neg Hx   . Early death Neg Hx   . Hearing loss Neg Hx   . Heart disease Neg Hx   . Hyperlipidemia Neg Hx   . Hypertension Neg Hx   . Kidney disease Neg Hx   . Learning disabilities Neg Hx   . Mental illness Neg Hx   . Mental retardation Neg Hx   . Miscarriages / Stillbirths Neg Hx   . Stroke Neg Hx   . Vision loss Neg Hx   . Asthma Maternal Grandmother   . Diabetes Maternal Grandfather     Allergies: No Known Allergies  Prescriptions prior to admission  Medication Sig Dispense Refill  . Prenatal Vit-Fe Fumarate-FA (PRENATAL MULTIVITAMIN) TABS tablet Take 1  tablet by mouth daily at 12 noon.      Marland Kitchen albuterol (PROVENTIL HFA;VENTOLIN HFA) 108 (90 BASE) MCG/ACT inhaler Inhale 2 puffs into the lungs every 6 (six) hours as needed for wheezing or shortness of breath.         Review of Systems  As per HPI   Blood pressure 104/69, pulse 86, resp. rate 20, last menstrual period 07/19/2012. General appearance: alert, cooperative and no distress. Appears uncomfortable during contractions.  Lungs: clear to auscultation bilaterally Heart: regular rate and rhythm Abdomen: soft, non-tender; bowel sounds normal Extremities: Homans sign is negative, no sign of DVT Presentation: cephalic by Dr. Ike Bene Fetal monitoring: Baseline 130 BPM, moderate variability, + accels, negative decels Uterine activity q4 mins  Dilation: 3 Effacement (%): 50 Exam by:: Dr Ike Bene   Prenatal labs:  ABO, Rh: O/Positive/-- (08/18 0000) Antibody: Negative (08/18 0000) Rubella:  Immune (8/18) RPR: Nonreactive (08/18 0000)  HBsAg: Negative (08/18 0000)  HIV:  Non-reactive (08/18 0000)  GBS: Negative (10/15 0000)  1 hr Glucola: 97 Genetic screening: presented to [facility] too late, declined genetic counseling Anatomy US: bilateral clubbed feet, echogenic intracardiac foci in left ventricle  No results found for this or any previous visit (from the past 12 hour(s)).]  Assessment: Dana Mcconnell is a 22 y.o. G2P1001 with an IUP at [redacted]w[redacted]d presenting for term early labor. Pt progressed from 1cm last week to 3 cm today. She is painfully contracting.   Plan: 1. Admit to Labor and Delivery. Routine orders placed.  2. Labor: Early labor. Progressing spontaneously at this time. Monitor for progression. 3. GBS negative.  4. She desires epidural for pain control. Anesthesia notified.  5. She plans to breast and bottle feed.  6. She desires Mirena IUD for postpartum contraception.  7. She is having a boy and is undecided on circumcision.    Hal Neer, MD 05/01/2013, 11:30 AM  Dana Mcconnell is a 22 y.o. G2P1001 at [redacted]w[redacted]d  here forSOOL  #Labor:monitor for labor #Pain: Desires epidural #FWB: Cat I #ID:  GBS neg #MOF: Breast #MOC:Mirena  I spoke with and examined patient and agree with resident's note and plan of care.  Tawana Scale, MD OB Fellow 05/01/2013 2:14 PM

## 2013-05-01 NOTE — Anesthesia Preprocedure Evaluation (Addendum)
Anesthesia Evaluation  Patient identified by MRN, date of birth, ID band Patient awake    Reviewed: Allergy & Precautions, H&P , Patient's Chart, lab work & pertinent test results  Airway Mallampati: II TM Distance: >3 FB Neck ROM: full    Dental  (+) Teeth Intact   Pulmonary asthma , former smoker,  breath sounds clear to auscultation        Cardiovascular Rhythm:regular Rate:Normal     Neuro/Psych    GI/Hepatic   Endo/Other    Renal/GU      Musculoskeletal   Abdominal   Peds  Hematology   Anesthesia Other Findings       Reproductive/Obstetrics (+) Pregnancy (failure to descend --> C/S)                          Anesthesia Physical Anesthesia Plan  ASA: II and emergent  Anesthesia Plan: Epidural   Post-op Pain Management:    Induction:   Airway Management Planned:   Additional Equipment:   Intra-op Plan:   Post-operative Plan:   Informed Consent: I have reviewed the patients History and Physical, chart, labs and discussed the procedure including the risks, benefits and alternatives for the proposed anesthesia with the patient or authorized representative who has indicated his/her understanding and acceptance.   Dental Advisory Given  Plan Discussed with: Surgeon and CRNA  Anesthesia Plan Comments: (Labs checked- platelets confirmed with RN in room. Fetal heart tracing, per RN, reported to be stable enough for sitting procedure. Discussed epidural, and patient consents to the procedure:  included risk of possible headache,backache, failed block, allergic reaction, and nerve injury. This patient was asked if she had any questions or concerns before the procedure started. )       Anesthesia Quick Evaluation

## 2013-05-01 NOTE — Anesthesia Procedure Notes (Signed)

## 2013-05-01 NOTE — Progress Notes (Signed)
LABOR PROGRESS NOTE  Dana Mcconnell is a 22 y.o. G2P1001 at [redacted]w[redacted]d  admitted for active labor  Subjective: Comfortable with epidural. Pt is hungry. Not yet feeling rectal or vaginal pressure.   Objective: BP 106/75  Pulse 91  Temp(Src) 97.8 F (36.6 C) (Oral)  Resp 20  Ht 5\' 5"  (1.651 m)  Wt 83.008 kg (183 lb)  BMI 30.45 kg/m2  SpO2 100%  LMP 07/19/2012 Total I/O In: 800 [Other:800] Out: -   FHT:  FHR: 130 bpm, variability: moderate,  accelerations:  Present,  decelerations:  Present some variables UC:   q2 mins SVE:   Dilation: 7 Effacement (%): 80 Station: 0 Exam by:: Donzetta Sprung, RN (pt stated she felt pressure)   Labs: Lab Results  Component Value Date   WBC 7.4 05/01/2013   HGB 10.4* 05/01/2013   HCT 30.0* 05/01/2013   MCV 88.5 05/01/2013   PLT 142* 05/01/2013    Assessment / Plan: Spontaneous labor, making progress  Labor: now s/p AROM with MSF. Monitor for continued cervical progression.  Fetal Wellbeing:  Category II Pain Control:  Epidural Anticipated MOD:  NSVD  Hal Neer, MD 05/01/2013, 5:55 PM

## 2013-05-02 ENCOUNTER — Encounter (HOSPITAL_COMMUNITY): Payer: Self-pay | Admitting: *Deleted

## 2013-05-02 ENCOUNTER — Encounter (HOSPITAL_COMMUNITY)
Admission: AD | Disposition: A | Discharge status: Home / Self Care | Payer: Self-pay | Source: Ambulatory Visit | Attending: Obstetrics & Gynecology

## 2013-05-02 DIAGNOSIS — O324XX0 Maternal care for high head at term, not applicable or unspecified: Secondary | ICD-10-CM

## 2013-05-02 SURGERY — Surgical Case
Anesthesia: Epidural | Site: Abdomen | Wound class: Clean Contaminated

## 2013-05-02 MED ORDER — DIPHENHYDRAMINE HCL 25 MG PO CAPS: 25.0000 mg | ORAL_CAPSULE | ORAL | Status: DC | PRN

## 2013-05-02 MED ORDER — KETOROLAC TROMETHAMINE 30 MG/ML IJ SOLN: 30.0000 mg | Freq: Four times a day (QID) | INTRAMUSCULAR | Status: DC | PRN

## 2013-05-02 MED ORDER — KETOROLAC TROMETHAMINE 60 MG/2ML IM SOLN
60.0000 mg | Freq: Once | INTRAMUSCULAR | Status: DC | PRN
  Filled 2013-05-02: qty 2

## 2013-05-02 MED ORDER — CEFAZOLIN SODIUM-DEXTROSE 2-3 GM-% IV SOLR: 2.0000 g | INTRAVENOUS | Status: DC

## 2013-05-02 MED ORDER — MORPHINE SULFATE (PF) 0.5 MG/ML IJ SOLN
INTRAMUSCULAR | Status: DC | PRN
  Administered 2013-05-02: 3000 ug via EPIDURAL
  Administered 2013-05-02: 2000 ug via INTRAVENOUS

## 2013-05-02 MED ORDER — KETOROLAC TROMETHAMINE 30 MG/ML IJ SOLN: 30.0000 mg | Freq: Four times a day (QID) | INTRAMUSCULAR | Status: AC | PRN

## 2013-05-02 MED ORDER — SCOPOLAMINE 1 MG/3DAYS TD PT72
1.0000 | MEDICATED_PATCH | Freq: Once | TRANSDERMAL | Status: DC
  Administered 2013-05-02: 1.5 mg via TRANSDERMAL

## 2013-05-02 MED ORDER — DIPHENHYDRAMINE HCL 50 MG/ML IJ SOLN: 12.5000 mg | INTRAMUSCULAR | Status: DC | PRN

## 2013-05-02 MED ORDER — FENTANYL CITRATE 0.05 MG/ML IJ SOLN
25.0000 ug | INTRAMUSCULAR | Status: DC | PRN
  Administered 2013-05-02 (×2): 50 ug via INTRAVENOUS

## 2013-05-02 MED ORDER — KETOROLAC TROMETHAMINE 30 MG/ML IJ SOLN
30.0000 mg | Freq: Four times a day (QID) | INTRAMUSCULAR | Status: DC | PRN
  Administered 2013-05-02: 30 mg via INTRAMUSCULAR

## 2013-05-02 MED ORDER — DIPHENHYDRAMINE HCL 50 MG/ML IJ SOLN: 25.0000 mg | INTRAMUSCULAR | Status: DC | PRN

## 2013-05-02 MED ORDER — SENNOSIDES-DOCUSATE SODIUM 8.6-50 MG PO TABS
2.0000 | ORAL_TABLET | ORAL | Status: DC
  Administered 2013-05-03 – 2013-05-04 (×2): 2 via ORAL
  Filled 2013-05-02 (×2): qty 2

## 2013-05-02 MED ORDER — DIPHENHYDRAMINE HCL 25 MG PO CAPS: 25.0000 mg | ORAL_CAPSULE | Freq: Four times a day (QID) | ORAL | Status: DC | PRN

## 2013-05-02 MED ORDER — PHENYLEPHRINE 40 MCG/ML (10ML) SYRINGE FOR IV PUSH (FOR BLOOD PRESSURE SUPPORT)
PREFILLED_SYRINGE | INTRAVENOUS | Status: AC
  Filled 2013-05-02: qty 10

## 2013-05-02 MED ORDER — SIMETHICONE 80 MG PO CHEW
80.0000 mg | CHEWABLE_TABLET | Freq: Three times a day (TID) | ORAL | Status: DC
  Administered 2013-05-02 – 2013-05-04 (×8): 80 mg via ORAL
  Filled 2013-05-02 (×8): qty 1

## 2013-05-02 MED ORDER — OXYTOCIN 10 UNIT/ML IJ SOLN
40.0000 [IU] | INTRAVENOUS | Status: DC | PRN
  Administered 2013-05-02: 40 [IU] via INTRAVENOUS

## 2013-05-02 MED ORDER — MENTHOL 3 MG MT LOZG: 1.0000 | LOZENGE | OROMUCOSAL | Status: DC | PRN

## 2013-05-02 MED ORDER — IBUPROFEN 600 MG PO TABS
600.0000 mg | ORAL_TABLET | Freq: Four times a day (QID) | ORAL | Status: DC
  Administered 2013-05-02 – 2013-05-04 (×8): 600 mg via ORAL
  Filled 2013-05-02 (×8): qty 1

## 2013-05-02 MED ORDER — MEPERIDINE HCL 25 MG/ML IJ SOLN: 6.2500 mg | INTRAMUSCULAR | Status: DC | PRN

## 2013-05-02 MED ORDER — ONDANSETRON HCL 4 MG/2ML IJ SOLN: 4.0000 mg | Freq: Three times a day (TID) | INTRAMUSCULAR | Status: DC | PRN

## 2013-05-02 MED ORDER — NALBUPHINE SYRINGE 5 MG/0.5 ML
5.0000 mg | INJECTION | INTRAMUSCULAR | Status: DC | PRN
  Filled 2013-05-02: qty 1

## 2013-05-02 MED ORDER — DIBUCAINE 1 % RE OINT: 1.0000 "application " | TOPICAL_OINTMENT | RECTAL | Status: DC | PRN

## 2013-05-02 MED ORDER — MORPHINE SULFATE 0.5 MG/ML IJ SOLN
INTRAMUSCULAR | Status: AC
  Filled 2013-05-02: qty 10

## 2013-05-02 MED ORDER — SIMETHICONE 80 MG PO CHEW: 80.0000 mg | CHEWABLE_TABLET | ORAL | Status: DC | PRN

## 2013-05-02 MED ORDER — LIDOCAINE-EPINEPHRINE (PF) 2 %-1:200000 IJ SOLN
INTRAMUSCULAR | Status: AC
  Filled 2013-05-02: qty 20

## 2013-05-02 MED ORDER — ONDANSETRON HCL 4 MG/2ML IJ SOLN
INTRAMUSCULAR | Status: AC
  Filled 2013-05-02: qty 2

## 2013-05-02 MED ORDER — OXYTOCIN 10 UNIT/ML IJ SOLN
INTRAMUSCULAR | Status: AC
  Filled 2013-05-02: qty 4

## 2013-05-02 MED ORDER — ZOLPIDEM TARTRATE 5 MG PO TABS: 5.0000 mg | ORAL_TABLET | Freq: Every evening | ORAL | Status: DC | PRN

## 2013-05-02 MED ORDER — SCOPOLAMINE 1 MG/3DAYS TD PT72
MEDICATED_PATCH | TRANSDERMAL | Status: AC
  Filled 2013-05-02: qty 1

## 2013-05-02 MED ORDER — ONDANSETRON HCL 4 MG/2ML IJ SOLN
INTRAMUSCULAR | Status: DC | PRN
  Administered 2013-05-02: 4 mg via INTRAVENOUS

## 2013-05-02 MED ORDER — OXYTOCIN 40 UNITS IN LACTATED RINGERS INFUSION - SIMPLE MED: 62.5000 mL/h | INTRAVENOUS | Status: AC

## 2013-05-02 MED ORDER — 0.9 % SODIUM CHLORIDE (POUR BTL) OPTIME
TOPICAL | Status: DC | PRN
  Administered 2013-05-02: 1000 mL

## 2013-05-02 MED ORDER — METOCLOPRAMIDE HCL 5 MG/ML IJ SOLN: 10.0000 mg | Freq: Once | INTRAMUSCULAR | Status: DC | PRN

## 2013-05-02 MED ORDER — MEPERIDINE HCL 25 MG/ML IJ SOLN
INTRAMUSCULAR | Status: DC | PRN
  Administered 2013-05-02 (×2): 12.5 mg via INTRAVENOUS

## 2013-05-02 MED ORDER — MEPERIDINE HCL 25 MG/ML IJ SOLN
INTRAMUSCULAR | Status: AC
  Filled 2013-05-02: qty 1

## 2013-05-02 MED ORDER — OXYCODONE-ACETAMINOPHEN 5-325 MG PO TABS
1.0000 | ORAL_TABLET | ORAL | Status: DC | PRN
  Administered 2013-05-02 – 2013-05-04 (×6): 1 via ORAL
  Filled 2013-05-02 (×7): qty 1

## 2013-05-02 MED ORDER — PRENATAL MULTIVITAMIN CH
1.0000 | ORAL_TABLET | Freq: Every day | ORAL | Status: DC
  Administered 2013-05-02 – 2013-05-04 (×3): 1 via ORAL
  Filled 2013-05-02 (×3): qty 1

## 2013-05-02 MED ORDER — LANOLIN HYDROUS EX OINT: 1.0000 "application " | TOPICAL_OINTMENT | CUTANEOUS | Status: DC | PRN

## 2013-05-02 MED ORDER — NALOXONE HCL 0.4 MG/ML IJ SOLN: 0.4000 mg | INTRAMUSCULAR | Status: DC | PRN

## 2013-05-02 MED ORDER — FENTANYL CITRATE 0.05 MG/ML IJ SOLN
INTRAMUSCULAR | Status: AC
  Administered 2013-05-02: 50 ug via INTRAVENOUS
  Filled 2013-05-02: qty 2

## 2013-05-02 MED ORDER — PNEUMOCOCCAL VAC POLYVALENT 25 MCG/0.5ML IJ INJ
0.5000 mL | INJECTION | INTRAMUSCULAR | Status: DC
  Filled 2013-05-02: qty 0.5

## 2013-05-02 MED ORDER — LACTATED RINGERS IV SOLN: INTRAVENOUS | Status: DC

## 2013-05-02 MED ORDER — PHENYLEPHRINE 8 MG IN D5W 100 ML (0.08MG/ML) PREMIX OPTIME
INJECTION | INTRAVENOUS | Status: AC
  Filled 2013-05-02: qty 100

## 2013-05-02 MED ORDER — WITCH HAZEL-GLYCERIN EX PADS: 1.0000 "application " | MEDICATED_PAD | CUTANEOUS | Status: DC | PRN

## 2013-05-02 MED ORDER — ONDANSETRON HCL 4 MG PO TABS: 4.0000 mg | ORAL_TABLET | ORAL | Status: DC | PRN

## 2013-05-02 MED ORDER — KETOROLAC TROMETHAMINE 30 MG/ML IJ SOLN
30.0000 mg | Freq: Four times a day (QID) | INTRAMUSCULAR | Status: AC | PRN
  Administered 2013-05-02: 30 mg via INTRAVENOUS
  Filled 2013-05-02: qty 1

## 2013-05-02 MED ORDER — METOCLOPRAMIDE HCL 5 MG/ML IJ SOLN
10.0000 mg | Freq: Three times a day (TID) | INTRAMUSCULAR | Status: DC | PRN
  Administered 2013-05-02: 10 mg via INTRAVENOUS
  Filled 2013-05-02: qty 2

## 2013-05-02 MED ORDER — CEFAZOLIN SODIUM-DEXTROSE 2-3 GM-% IV SOLR
INTRAVENOUS | Status: AC
  Filled 2013-05-02: qty 50

## 2013-05-02 MED ORDER — SODIUM CHLORIDE 0.9 % IJ SOLN: 3.0000 mL | INTRAMUSCULAR | Status: DC | PRN

## 2013-05-02 MED ORDER — LACTATED RINGERS IV SOLN
INTRAVENOUS | Status: DC | PRN
  Administered 2013-05-02: 02:00:00 via INTRAVENOUS

## 2013-05-02 MED ORDER — CEFAZOLIN SODIUM-DEXTROSE 2-3 GM-% IV SOLR
INTRAVENOUS | Status: DC | PRN
  Administered 2013-05-02: 2 g via INTRAVENOUS

## 2013-05-02 MED ORDER — SIMETHICONE 80 MG PO CHEW
80.0000 mg | CHEWABLE_TABLET | ORAL | Status: DC
  Administered 2013-05-03 – 2013-05-04 (×2): 80 mg via ORAL
  Filled 2013-05-02 (×2): qty 1

## 2013-05-02 MED ORDER — NALOXONE HCL 1 MG/ML IJ SOLN
1.0000 ug/kg/h | INTRAMUSCULAR | Status: DC | PRN
  Filled 2013-05-02: qty 2

## 2013-05-02 MED ORDER — TETANUS-DIPHTH-ACELL PERTUSSIS 5-2.5-18.5 LF-MCG/0.5 IM SUSP: 0.5000 mL | Freq: Once | INTRAMUSCULAR | Status: DC

## 2013-05-02 MED ORDER — KETOROLAC TROMETHAMINE 30 MG/ML IJ SOLN
INTRAMUSCULAR | Status: AC
  Administered 2013-05-02: 30 mg via INTRAVENOUS
  Filled 2013-05-02: qty 1

## 2013-05-02 MED ORDER — PHENYLEPHRINE HCL 10 MG/ML IJ SOLN
INTRAMUSCULAR | Status: DC | PRN
  Administered 2013-05-02 (×4): 80 ug via INTRAVENOUS
  Administered 2013-05-02: 40 ug via INTRAVENOUS

## 2013-05-02 MED ORDER — LACTATED RINGERS IV SOLN
INTRAVENOUS | Status: DC
  Administered 2013-05-02: 06:00:00 via INTRAVENOUS

## 2013-05-02 MED ORDER — ONDANSETRON HCL 4 MG/2ML IJ SOLN: 4.0000 mg | INTRAMUSCULAR | Status: DC | PRN

## 2013-05-02 SURGICAL SUPPLY — 27 items
BARRIER ADHS 3X4 INTERCEED (GAUZE/BANDAGES/DRESSINGS) IMPLANT
CLAMP CORD UMBIL (MISCELLANEOUS) ×2 IMPLANT
CLOTH BEACON ORANGE TIMEOUT ST (SAFETY) ×2 IMPLANT
DRAPE LG THREE QUARTER DISP (DRAPES) ×2 IMPLANT
DRSG OPSITE POSTOP 4X10 (GAUZE/BANDAGES/DRESSINGS) ×2 IMPLANT
DURAPREP 26ML APPLICATOR (WOUND CARE) ×2 IMPLANT
ELECT REM PT RETURN 9FT ADLT (ELECTROSURGICAL) ×2
ELECTRODE REM PT RTRN 9FT ADLT (ELECTROSURGICAL) ×1 IMPLANT
EXTRACTOR VACUUM KIWI (MISCELLANEOUS) IMPLANT
GLOVE BIO SURGEON STRL SZ 6.5 (GLOVE) ×2 IMPLANT
GLOVE BIOGEL PI IND STRL 7.0 (GLOVE) ×1 IMPLANT
GLOVE BIOGEL PI INDICATOR 7.0 (GLOVE) ×1
GOWN PREVENTION PLUS XLARGE (GOWN DISPOSABLE) ×4 IMPLANT
GOWN STRL REIN XL XLG (GOWN DISPOSABLE) ×4 IMPLANT
KIT ABG SYR 3ML LUER SLIP (SYRINGE) ×2 IMPLANT
NEEDLE HYPO 25X5/8 SAFETYGLIDE (NEEDLE) ×2 IMPLANT
NS IRRIG 1000ML POUR BTL (IV SOLUTION) ×2 IMPLANT
PACK C SECTION WH (CUSTOM PROCEDURE TRAY) ×2 IMPLANT
PAD ABD 7.5X8 STRL (GAUZE/BANDAGES/DRESSINGS) ×2 IMPLANT
PAD OB MATERNITY 4.3X12.25 (PERSONAL CARE ITEMS) ×2 IMPLANT
SUT VIC AB 0 CT1 36 (SUTURE) ×8 IMPLANT
SUT VIC AB 2-0 CT1 27 (SUTURE) ×1
SUT VIC AB 2-0 CT1 TAPERPNT 27 (SUTURE) ×1 IMPLANT
SUT VIC AB 4-0 PS2 27 (SUTURE) ×2 IMPLANT
TOWEL OR 17X24 6PK STRL BLUE (TOWEL DISPOSABLE) ×2 IMPLANT
TRAY FOLEY CATH 14FR (SET/KITS/TRAYS/PACK) ×2 IMPLANT
WATER STERILE IRR 1000ML POUR (IV SOLUTION) IMPLANT

## 2013-05-02 NOTE — Progress Notes (Signed)
Dana Mcconnell is a 22 y.o. G2P1001 at [redacted]w[redacted]d by LMP admitted for active labor  Subjective:pushing    Objective: BP 131/68  Pulse 122  Temp(Src) 98.4 F (36.9 C) (Oral)  Resp 18  Ht 5\' 5"  (1.651 m)  Wt 183 lb (83.008 kg)  BMI 30.45 kg/m2  SpO2 100%  LMP 07/19/2012 I/O last 3 completed shifts: In: 1800 [I.V.:1000; Other:800] Out: -  Total I/O In: -  Out: 400 [Urine:400]  FHT:  FHR: 120 bpm, variability: moderate,  accelerations:  Present,  decelerations:  Present variable UC:   regular, every 3 minutes SVE:   Dilation: Lip/rim Effacement (%): 100 Station: 0 Exam by:: Gardiner Coins RN  Labs: Lab Results  Component Value Date   WBC 7.4 05/01/2013   HGB 10.4* 05/01/2013   HCT 30.0* 05/01/2013   MCV 88.5 05/01/2013   PLT 142* 05/01/2013    Assessment / Plan: Arrest of decent  Labor: second stage arrest Preeclampsia:  no signs or symptoms of toxicity Fetal Wellbeing:  Category II Pain Control:  Epidural I/D:  n/a Anticipated MOD:  Offered cesarean section for second stage arrest. I explained the indication, risk  anesthesia, bleeding, infection, pain, visceral organ damage and questions were answered.   ARNOLD,JAMES 05/02/2013, 1:03 AM

## 2013-05-02 NOTE — Progress Notes (Signed)
Decision for Cesarean by Dr. Debroah Loop secondary arrest of descent

## 2013-05-02 NOTE — Progress Notes (Signed)
Dana Mcconnell, CNM @ bedside viewing FHR tracing.  CNM also informed of FHR tracing & nurse interventions.

## 2013-05-02 NOTE — Progress Notes (Signed)
Dr. Michail Jewels asked to review FHR tracing secondary late decels.  MD informed Pitocin off, IVF bolus given, & O2 10 lmp applied via nonrebreather mask.  Dr. Michail Jewels in room to see pt and eval status.

## 2013-05-02 NOTE — Transfer of Care (Signed)
Immediate Anesthesia Transfer of Care Note  Patient: Dana Mcconnell  Procedure(s) Performed: Procedure(s): Primary Ceasrean Section Delivery Baby Boy @ [phone], Apgars 7/9 (N/A)  Patient Location: PACU  Anesthesia Type:Epidural  Level of Consciousness: awake, alert  and oriented  Airway & Oxygen Therapy: Patient Spontanous Breathing  Post-op Assessment: Report given to PACU RN and Post -op Vital signs reviewed and stable  Post vital signs: Reviewed and stable  Complications: No apparent anesthesia complications

## 2013-05-02 NOTE — Progress Notes (Signed)
UR chart review completed.  

## 2013-05-02 NOTE — Op Note (Signed)
Procedure: Primary low transverse cesarean section Preoperative diagnosis: Uterine pregnancy 40 weeks 6 days gestation, second stage arrest of labor Postoperative diagnosis: Intrauterine pregnancy delivered Surgeon: Dr. Scheryl Darter Anesthesia: Epidural Estimated blood loss: 600 mL Specimens: None Drains: Foley catheter Complications: None Counts: Correct Findings: Liveborn female infant vigorous at birth, weight pending  Patient gave written consent for cesarean section after diagnosis of second stage arrest was made. Patient presented in active labor at 40 weeks 5 days gestation. Patient identification was confirmed and she was brought to the operating room. Adequate epidural anesthesia was induced. His placed in dorsal supine position left lateral tilt. Abdomen perineum and vagina were sterilely prepped and draped. Foley catheter was placed. 10 blade was used to make an incision was incision. Incision was taken down the fascia the fascial incision was extended transversely with curved Mayo scissors. The rectus muscles were separated midline. Underlying peritoneum was elevated and incised with Metzenbaum scissors and the incision was extended. The Alexis retractor was placed. 10 blade was used to make a transverse incision at the lower uterine segment. The incision was extended transversely and fetal head was elevated from deep in the pelvis. Infant was delivered with a loose nuchal cord. Mouth and nose were cleared with bulb suction. Cord clamped and cut and infant was handed to nursery personnel. Infant was vigorous at birth. It was a liveborn female. Cord specimens were obtained and the placenta was delivered manually. Patient received IV Pitocin. Uterine cavity was explored. The uterine incision was closed with a running locking suture with 0 Vicryl. An imbricating layer followed with 0 Vicryl and good hemostasis was seen. Both adnexa were normal. The anterior peritoneum was closed with a running  suture was 2-0 Vicryl. Fascia was closed with running suture with 0 Vicryl. Hemostasis was assured and the incision and the wound was irrigated. Skin was closed with a running subcuticular suture with 4-0 Vicryl. Sterile dressing was applied. Patient tolerated the procedure well complications and she was brought in stable condition to PACU.  Dr. Scheryl Darter 05/02/2013 0217

## 2013-05-02 NOTE — Anesthesia Postprocedure Evaluation (Signed)
  Anesthesia Post-op Note  Anesthesia Post Note  Patient: Dana Mcconnell  Procedure(s) Performed: Procedure(s) (LRB): Primary Ceasrean Section Delivery Baby Boy @ [phone], Apgars 7/9 (N/A)  Anesthesia type: Epidural  Patient location: PACU  Post pain: Pain level controlled  Post assessment: Post-op Vital signs reviewed  Last Vitals:  Filed Vitals:   05/02/13 0300  BP:   Pulse:   Temp:   Resp: 18    Post vital signs: stable  Level of consciousness: awake  Complications: No apparent anesthesia complications

## 2013-05-02 NOTE — Progress Notes (Signed)
Dr.'s Andrey Farmer @ bedside.  Dr. Debroah Loop into view FHR tracing & eval pt progress with pushing

## 2013-05-02 NOTE — Progress Notes (Signed)
In the OR baby fhr 110 with decels to 80's, anesthesia dosed patient with phenylephrine. Dr Debroah Loop aware and at bedside

## 2013-05-02 NOTE — Anesthesia Postprocedure Evaluation (Signed)
Anesthesia Post Note  Patient: Dana Mcconnell  Procedure(s) Performed: Procedure(s) (LRB): Primary Ceasrean Section Delivery Baby Boy @ [phone], Apgars 7/9 (N/A)  Anesthesia type: Epidural  Patient location: Mother/Baby  Post pain: Pain level controlled  Post assessment: Post-op Vital signs reviewed  Last Vitals:  Filed Vitals:   05/02/13 0520  BP: 118/62  Pulse: 94  Temp: 36.6 C  Resp: 18    Post vital signs: Reviewed  Level of consciousness:alert  Complications: No apparent anesthesia complications

## 2013-05-03 LAB — CBC
Hemoglobin: 8.3 g/dL — ABNORMAL LOW (ref 12.0–15.0)
MCV: 89.2 fL (ref 78.0–100.0)
RBC: 2.69 MIL/uL — ABNORMAL LOW (ref 3.87–5.11)
WBC: 10.2 10*3/uL (ref 4.0–10.5)

## 2013-05-03 NOTE — Progress Notes (Signed)
Subjective: Postpartum Day #1: Cesarean Delivery Patient reports tolerating PO and + flatus.  Breast and bottlefeeding; plans on IUD for contraception; CBC not drawn  Objective: Vital signs in last 24 hours: Temp:  [97.7 F (36.5 C)-99.1 F (37.3 C)] 99.1 F (37.3 C) (11/15 0625) Pulse Rate:  [69-92] 91 (11/15 0625) Resp:  [16-18] 18 (11/15 0625) BP: (106-116)/(54-77) 106/70 mmHg (11/15 0625) SpO2:  [99 %-100 %] 100 % (11/14 1730)  Physical Exam:  General: alert, cooperative and mild distress Heart: RRR Lungs: nl effort Lochia: appropriate Uterine Fundus: firm Incision: pressure dsg CDI, no drainage DVT Evaluation: No evidence of DVT seen on physical exam.   Recent Labs  05/01/13 1150  HGB 10.4*  HCT 30.0*    Assessment/Plan: Status post Cesarean section. Doing well postoperatively.  Continue current care- anticipate d/c in AM 11/16 Will get CBC this AM.  Dana Mcconnell 05/03/2013, 7:23 AM

## 2013-05-04 MED ORDER — OXYCODONE-ACETAMINOPHEN 5-325 MG PO TABS: 1.0000 | ORAL_TABLET | ORAL | Status: DC | PRN

## 2013-05-04 MED ORDER — IBUPROFEN 600 MG PO TABS: 600.0000 mg | ORAL_TABLET | Freq: Four times a day (QID) | ORAL | Status: DC | PRN

## 2013-05-04 NOTE — Discharge Summary (Signed)
Obstetric Discharge Summary Reason for Admission: onset of labor Prenatal Procedures: ultrasound Intrapartum Procedures: cesarean: low cervical, transverse, primary Postpartum Procedures: none Complications-Operative and Postpartum: none Eating, drinking, voiding, ambulating well.  +flatus and has had a bm.  Lochia and pain wnl.  Denies dizziness, lightheadedness, or sob. No complaints.   Hemoglobin  Date Value Range Status  05/03/2013 8.3* 12.0 - 15.0 g/dL Final     REPEATED TO VERIFY     DELTA CHECK NOTED     HCT  Date Value Range Status  05/03/2013 24.0* 36.0 - 46.0 % Final   Hospital Course: Admitted on 11/13 in early labor at 3/50, augmented w/ pitocin after 8cm, progressed to 10cm/0 station, had some fhr decelerations, pushed x 1.5hrs, PLTCS for FTD at 0 station. Infant weighed 8lb 11.7oz. Postpartum course has been uneventful.  Physical Exam:  General: alert, cooperative and no distress Lochia: appropriate Uterine Fundus: firm Incision: healing well, no significant drainage, no dehiscence, no significant erythema, honeycomb dressing intact DVT Evaluation: No evidence of DVT seen on physical exam. Negative Homan's sign. No cords or calf tenderness. No significant calf/ankle edema.  Discharge Diagnoses: Term Pregnancy-delivered, PLTCS for failure to descend  Discharge Information: Date: 05/04/2013 Activity: pelvic rest Diet: routine Medications: PNV, Ibuprofen and Percocet Condition: stable Instructions: refer to practice specific booklet Discharge to: home Follow-up Information   Schedule an appointment as soon as possible for a visit with [facility] [place]. (4-6 weeks for your postpartum visit, and one for your nexplanon insertion)    Contact information:   [address] [address] [phone]      Newborn Data: Live born female  Birth Weight: 8 lb 11.7 oz (3960 g) APGAR: 7, 9  Home with mother. Breast/bottlefeeding, plans nexplanon  for contraception, desires OP circumcision  Marge Duncans 05/04/2013, 7:28 AM

## 2013-05-05 ENCOUNTER — Inpatient Hospital Stay (HOSPITAL_COMMUNITY): Admission: RE | Admit: 2013-05-05 | Payer: Medicaid Other | Source: Ambulatory Visit

## 2013-05-05 ENCOUNTER — Encounter (HOSPITAL_COMMUNITY): Payer: Self-pay | Admitting: Obstetrics & Gynecology

## 2013-05-06 NOTE — Discharge Summary (Signed)
Attestation of Attending Supervision of Advanced Practitioner (CNM/NP): Evaluation and management procedures were performed by the Advanced Practitioner under my supervision and collaboration.  I have reviewed the Advanced Practitioner's note and chart, and I agree with the management and plan.  Gertrude Bucks 05/06/2013 9:35 AM   

## 2013-06-16 ENCOUNTER — Inpatient Hospital Stay (HOSPITAL_COMMUNITY)
Admission: AD | Admit: 2013-06-16 | Discharge: 2013-06-16 | Disposition: A | Discharge status: Home / Self Care | Payer: Medicaid Other | Source: Ambulatory Visit | Attending: Obstetrics & Gynecology | Admitting: Obstetrics & Gynecology

## 2013-06-16 ENCOUNTER — Encounter (HOSPITAL_COMMUNITY): Payer: Self-pay | Admitting: *Deleted

## 2013-06-16 DIAGNOSIS — O909 Complication of the puerperium, unspecified: Secondary | ICD-10-CM | POA: Insufficient documentation

## 2013-06-16 DIAGNOSIS — L089 Local infection of the skin and subcutaneous tissue, unspecified: Secondary | ICD-10-CM

## 2013-06-16 MED ORDER — CLINDAMYCIN HCL 300 MG PO CAPS: 300.0000 mg | ORAL_CAPSULE | Freq: Three times a day (TID) | ORAL | Status: DC

## 2013-06-16 NOTE — MAU Provider Note (Signed)
S: Pt w/ c-section [id number] and here for complaints of incisional pain that has been worsening over the last 2 weeks. Pt complaints of 8/10 sharp pain at incision that has been worsening. Pt states that she also has an URI but does not seem to be related. NO discharge, no exudate, no fevers. No other complaints at this time  O:  Filed Vitals:   06/16/13 1718  BP: 123/85  Pulse: 66  Temp: 98.8 F (37.1 C)  Resp: 18   VSS, NAD Incisional pain on left incision with palpable nodule. Minimal erythema. No other abdominal pain. No rebound.   A/P: Dana Mcconnell is a 22 y.o. G2P2002 is approx 6wk post op with worsening incisional pain that may be related to a superficial skin infection. Will treat with clinda 300 TID x7d and recommend motrin and warm compress. Pt given return precautions for wrosening symptoms. Does not appear to involve below the incision without rebound. Pt states understanding  Tawana Scale, MD [facility] Fellow

## 2013-06-16 NOTE — MAU Note (Signed)
1.2in area below left end of incision. Very tender to touch. No drainage, no redness. Incision is healed.  Pain has been increasing, ibuprofen not helping.

## 2013-06-16 NOTE — MAU Note (Signed)
C/s on 11/14. Had post of visit, was still hurting- was told it was normal. Pain is getting worse, found a knot at the edge yesterday morning.

## 2013-06-16 NOTE — MAU Note (Signed)
Slightly edematous and pink just below C/S incision. Extremely tender to touch for the past two weeks, says she called  MD and was told to come to MAU.

## 2013-10-21 ENCOUNTER — Encounter (HOSPITAL_COMMUNITY): Payer: Self-pay | Admitting: Emergency Medicine

## 2013-10-21 ENCOUNTER — Emergency Department (INDEPENDENT_AMBULATORY_CARE_PROVIDER_SITE_OTHER): Payer: Medicaid Other

## 2013-10-21 ENCOUNTER — Emergency Department (HOSPITAL_COMMUNITY)
Admission: EM | Admit: 2013-10-21 | Discharge: 2013-10-21 | Disposition: A | Discharge status: Home / Self Care | Payer: Medicaid Other | Source: Home / Self Care

## 2013-10-21 DIAGNOSIS — R1011 Right upper quadrant pain: Secondary | ICD-10-CM

## 2013-10-21 LAB — POCT URINALYSIS DIP (DEVICE)
Bilirubin Urine: NEGATIVE
Glucose, UA: NEGATIVE mg/dL
Hgb urine dipstick: NEGATIVE
Ketones, ur: NEGATIVE mg/dL
Nitrite: NEGATIVE
PH: 7 (ref 5.0–8.0)
PROTEIN: NEGATIVE mg/dL
Specific Gravity, Urine: 1.015 (ref 1.005–1.030)
UROBILINOGEN UA: 0.2 mg/dL (ref 0.0–1.0)

## 2013-10-21 LAB — POCT PREGNANCY, URINE: Preg Test, Ur: NEGATIVE

## 2013-10-21 NOTE — ED Notes (Signed)
Patient c/o right side flank pain x 5 days. Patient reports she just had a menstrual period and it was normal. No urinary or digestive problems. Patient denies n/v/d. Patient is alert and oriented and in no acute distress.

## 2013-10-21 NOTE — Discharge Instructions (Signed)
Abdominal Pain, Adult Have Ultrasound 10/22/13 at the hospital in the AM at 8:45 Many things can cause abdominal pain. Usually, abdominal pain is not caused by a disease and will improve without treatment. It can often be observed and treated at home. Your health care provider will do a physical exam and possibly order blood tests and X-rays to help determine the seriousness of your pain. However, in many cases, more time must pass before a clear cause of the pain can be found. Before that point, your health care provider may not know if you need more testing or further treatment. HOME CARE INSTRUCTIONS  Monitor your abdominal pain for any changes. The following actions may help to alleviate any discomfort you are experiencing:  Only take over-the-counter or prescription medicines as directed by your health care provider.  Do not take laxatives unless directed to do so by your health care provider.  Try a clear liquid diet (broth, tea, or water) as directed by your health care provider. Slowly move to a bland diet as tolerated. SEEK MEDICAL CARE IF:  You have unexplained abdominal pain.  You have abdominal pain associated with nausea or diarrhea.  You have pain when you urinate or have a bowel movement.  You experience abdominal pain that wakes you in the night.  You have abdominal pain that is worsened or improved by eating food.  You have abdominal pain that is worsened with eating fatty foods. SEEK IMMEDIATE MEDICAL CARE IF:   Your pain does not go away within 2 hours.  You have a fever.  You keep throwing up (vomiting).  Your pain is felt only in portions of the abdomen, such as the right side or the left lower portion of the abdomen.  You pass bloody or black tarry stools. MAKE SURE YOU:  Understand these instructions.   Will watch your condition.   Will get help right away if you are not doing well or get worse.  Document Released: 03/15/2005 Document Revised:  03/26/2013 Document Reviewed: 02/12/2013 [facility] Patient Information 2014 [employer], [place].  Biliary Colic  Biliary colic is a steady or irregular pain in the upper abdomen. It is usually under the right side of the rib cage. It happens when gallstones interfere with the normal flow of bile from the gallbladder. Bile is a liquid that helps to digest fats. Bile is made in the liver and stored in the gallbladder. When you eat a meal, bile passes from the gallbladder through the cystic duct and the common bile duct into the small intestine. There, it mixes with partially digested food. If a gallstone blocks either of these ducts, the normal flow of bile is blocked. The muscle cells in the bile duct contract forcefully to try to move the stone. This causes the pain of biliary colic.  SYMPTOMS   A person with biliary colic usually complains of pain in the upper abdomen. This pain can be:  In the center of the upper abdomen just below the breastbone.  In the upper-right part of the abdomen, near the gallbladder and liver.  Spread back toward the right shoulder blade.  Nausea and vomiting.  The pain usually occurs after eating.  Biliary colic is usually triggered by the digestive system's demand for bile. The demand for bile is high after fatty meals. Symptoms can also occur when a person who has been fasting suddenly eats a very large meal. Most episodes of biliary colic pass after 1 to 5 hours. After the most intense  pain passes, your abdomen may continue to ache mildly for about 24 hours. DIAGNOSIS  After you describe your symptoms, your caregiver will perform a physical exam. He or she will pay attention to the upper right portion of your belly (abdomen). This is the area of your liver and gallbladder. An ultrasound will help your caregiver look for gallstones. Specialized scans of the gallbladder may also be done. Blood tests may be done, especially if you have fever or if your pain  persists. PREVENTION  Biliary colic can be prevented by controlling the risk factors for gallstones. Some of these risk factors, such as heredity, increasing age, and pregnancy are a normal part of life. Obesity and a high-fat diet are risk factors you can change through a healthy lifestyle. Women going through menopause who take hormone replacement therapy (estrogen) are also more likely to develop biliary colic. TREATMENT   Pain medication may be prescribed.  You may be encouraged to eat a fat-free diet.  If the first episode of biliary colic is severe, or episodes of colic keep retuning, surgery to remove the gallbladder (cholecystectomy) is usually recommended. This procedure can be done through small incisions using an instrument called a laparoscope. The procedure often requires a brief stay in the hospital. Some people can leave the hospital the same day. It is the most widely used treatment in people troubled by painful gallstones. It is effective and safe, with no complications in more than 90% of cases.  If surgery cannot be done, medication that dissolves gallstones may be used. This medication is expensive and can take months or years to work. Only small stones will dissolve.  Rarely, medication to dissolve gallstones is combined with a procedure called shock-wave lithotripsy. This procedure uses carefully aimed shock waves to break up gallstones. In many people treated with this procedure, gallstones form again within a few years. PROGNOSIS  If gallstones block your cystic duct or common bile duct, you are at risk for repeated episodes of biliary colic. There is also a 25% chance that you will develop a gallbladder infection(acute cholecystitis), or some other complication of gallstones within 10 to 20 years. If you have surgery, schedule it at a time that is convenient for you and at a time when you are not sick. HOME CARE INSTRUCTIONS   Drink plenty of clear fluids.  Avoid fatty,  greasy or fried foods, or any foods that make your pain worse.  Take medications as directed. SEEK MEDICAL CARE IF:   You develop a fever over 100.5 F (38.1 C).  Your pain gets worse over time.  You develop nausea that prevents you from eating and drinking.  You develop vomiting. SEEK IMMEDIATE MEDICAL CARE IF:   You have continuous or severe belly (abdominal) pain which is not relieved with medications.  You develop nausea and vomiting which is not relieved with medications.  You have symptoms of biliary colic and you suddenly develop a fever and shaking chills. This may signal cholecystitis. Call your caregiver immediately.  You develop a yellow color to your skin or the white part of your eyes (jaundice). Document Released: 11/06/2005 Document Revised: 08/28/2011 Document Reviewed: 01/16/2008 [facility] Patient Information 2014 [employer], [place].

## 2013-10-21 NOTE — ED Provider Notes (Signed)
CSN: [id number]     Arrival date & time 10/21/13  1209 History   First MD Initiated Contact with Patient 10/21/13 1351     Chief Complaint  Patient presents with  . Flank Pain   (Consider location/radiation/quality/duration/timing/severity/associated sxs/prior Treatment) HPI Comments: 23 year old female with a five-day history of right anterolateral abdominal pain. The patient mentioned that it was her rib and it hurts only with breathing. After exhaling and prior to inhalation there is no pain. She denies any known trauma. She is 4 months post partum. Her last menses ended 1 week ago.   Past Medical History  Diagnosis Date  . Asthma     inhaler prn  . Chlamydia    Past Surgical History  Procedure Laterality Date  . Tonsillectomy    . Umbilical hernia repair      > 10 yrs ago   . Knee dislocation surgery  2009    left  . Hernia repair    . Cesarean section N/A 05/02/2013    Procedure: Primary Ceasrean Section Delivery Baby Boy @ 0135, Apgars 7/9;  Surgeon: Adam PhenixJames G Arnold, MD;  Location: [facility];  Service: Obstetrics;  Laterality: N/A;   Family History  Problem Relation Age of Onset  . Anesthesia problems Neg Hx   . Hypotension Neg Hx   . Malignant hyperthermia Neg Hx   . Pseudochol deficiency Neg Hx   . Alcohol abuse Neg Hx   . Arthritis Neg Hx   . Birth defects Neg Hx   . COPD Neg Hx   . Cancer Neg Hx   . Depression Neg Hx   . Drug abuse Neg Hx   . Early death Neg Hx   . Hearing loss Neg Hx   . Heart disease Neg Hx   . Hyperlipidemia Neg Hx   . Hypertension Neg Hx   . Kidney disease Neg Hx   . Learning disabilities Neg Hx   . Mental illness Neg Hx   . Mental retardation Neg Hx   . Miscarriages / Stillbirths Neg Hx   . Stroke Neg Hx   . Vision loss Neg Hx   . Asthma Maternal Grandmother   . Diabetes Maternal Grandfather    History  Substance Use Topics  . Smoking status: Former Smoker    Quit date: 08/21/2010  . Smokeless tobacco: Never Used  . Alcohol Use:  No   OB History   Grav Para Term Preterm Abortions TAB SAB Ect Mult Living   2 2 2  0 0 0 0 0 0 2     Review of Systems  Constitutional: Positive for activity change. Negative for fever and fatigue.  Respiratory: Negative.   Cardiovascular: Negative.   Gastrointestinal: Positive for abdominal pain. Negative for nausea, vomiting, diarrhea, constipation and abdominal distention.  Genitourinary: Negative.   Musculoskeletal: Negative.   Skin: Negative.     Allergies  Review of patient's allergies indicates no known allergies.  Home Medications   Prior to Admission medications   Medication Sig Start Date End Date Taking? Authorizing Provider  albuterol (PROVENTIL HFA;VENTOLIN HFA) 108 (90 BASE) MCG/ACT inhaler Inhale 2 puffs into the lungs every 6 (six) hours as needed for wheezing or shortness of breath.    Historical Provider, MD  clindamycin (CLEOCIN) 300 MG capsule Take 1 capsule (300 mg total) by mouth 3 (three) times daily. 06/16/13   Minta BalsamMichael R Odom, MD  ibuprofen (ADVIL,MOTRIN) 600 MG tablet Take 1 tablet (600 mg total) by mouth every 6 (six) hours  as needed for mild pain, moderate pain or cramping. 05/04/13   Marge DuncansKimberly Randall Booker, CNM  oxyCODONE-acetaminophen (PERCOCET/ROXICET) 5-325 MG per tablet Take 1-2 tablets by mouth every 4 (four) hours as needed for severe pain (moderate - severe pain). 05/04/13   Marge DuncansKimberly Randall Booker, CNM  Prenatal Vit-Fe Fumarate-FA (PRENATAL MULTIVITAMIN) TABS tablet Take 1 tablet by mouth daily at 12 noon.    Historical Provider, MD   BP 124/71  Pulse 71  Temp(Src) 98.4 F (36.9 C) (Oral)  Resp 16  SpO2 100% Physical Exam  Nursing note and vitals reviewed. Constitutional: She is oriented to person, place, and time. She appears well-developed and well-nourished. No distress.  Neck: Normal range of motion. Neck supple.  Cardiovascular: Normal rate, regular rhythm and normal heart sounds.   Pulmonary/Chest: Effort normal and breath sounds  normal. No respiratory distress. She exhibits no tenderness.  Abdominal: Soft. Bowel sounds are normal. She exhibits no distension and no mass. There is tenderness. There is guarding. There is no rebound.  Tender primarily to the RUQ of abdomen. With moderate palpation of RUQ and taking a deep breath this elicits much pain. No true Murphy's. No tenderness to the lower quadrants, pelvis or left hemiabdomen  Musculoskeletal: She exhibits no edema and no tenderness.  Neurological: She is alert and oriented to person, place, and time.  Skin: Skin is warm and dry.  Psychiatric: She has a normal mood and affect.    ED Course  Procedures (including critical care time) Labs Review Labs Reviewed  POCT URINALYSIS DIP (DEVICE) - Abnormal; Notable for the following:    Leukocytes, UA SMALL (*)    All other components within normal limits  POCT PREGNANCY, URINE    Imaging Review Dg Abd 1 View  10/21/2013   CLINICAL DATA:  Right-sided pain for 4-5 days.  EXAM: ABDOMEN - 1 VIEW  COMPARISON:  None.  FINDINGS: The bowel gas pattern is normal. No radio-opaque calculi or other significant radiographic abnormality are seen.  IMPRESSION: Negative.   Electronically Signed   By: Davonna BellingJohn  Curnes M.D.   On: 10/21/2013 14:36     MDM   1. RUQ abdominal pain     RUQ pain suspect cholestatic dz for now. Do not suspect surgical abdomen We were planning to have OP U/S this PM but pt fed herself from the food dispenser in the lobby prior to be seen.  Has appt made for abd U/S in AM at 8;45. Pt aware, NPO p MN    Hayden Rasmussenavid Lander Eslick, NP 10/21/13 1510

## 2013-10-21 NOTE — ED Provider Notes (Signed)
Medical screening examination/treatment/procedure(s) were performed by resident physician or non-physician practitioner and as supervising physician I was immediately available for consultation/collaboration.   Cyani Kallstrom DOUGLAS MD.   Enslee Bibbins D Ashling Roane, MD 10/21/13 2150 

## 2013-10-22 ENCOUNTER — Ambulatory Visit (HOSPITAL_COMMUNITY): Payer: Medicaid Other

## 2014-04-20 ENCOUNTER — Encounter (HOSPITAL_COMMUNITY): Payer: Self-pay | Admitting: Emergency Medicine

## 2014-06-17 ENCOUNTER — Encounter (HOSPITAL_COMMUNITY): Payer: Self-pay | Admitting: *Deleted

## 2014-06-17 ENCOUNTER — Emergency Department (HOSPITAL_COMMUNITY)
Admission: EM | Admit: 2014-06-17 | Discharge: 2014-06-17 | Disposition: A | Discharge status: Home / Self Care | Payer: Medicaid Other | Attending: Emergency Medicine | Admitting: Emergency Medicine

## 2014-06-17 ENCOUNTER — Emergency Department (HOSPITAL_COMMUNITY): Payer: Medicaid Other

## 2014-06-17 DIAGNOSIS — R634 Abnormal weight loss: Secondary | ICD-10-CM | POA: Insufficient documentation

## 2014-06-17 DIAGNOSIS — Z79899 Other long term (current) drug therapy: Secondary | ICD-10-CM | POA: Insufficient documentation

## 2014-06-17 DIAGNOSIS — Z87891 Personal history of nicotine dependence: Secondary | ICD-10-CM | POA: Insufficient documentation

## 2014-06-17 DIAGNOSIS — R05 Cough: Secondary | ICD-10-CM

## 2014-06-17 DIAGNOSIS — R42 Dizziness and giddiness: Secondary | ICD-10-CM

## 2014-06-17 DIAGNOSIS — R059 Cough, unspecified: Secondary | ICD-10-CM

## 2014-06-17 DIAGNOSIS — Z8619 Personal history of other infectious and parasitic diseases: Secondary | ICD-10-CM | POA: Insufficient documentation

## 2014-06-17 DIAGNOSIS — R231 Pallor: Secondary | ICD-10-CM | POA: Insufficient documentation

## 2014-06-17 DIAGNOSIS — J45909 Unspecified asthma, uncomplicated: Secondary | ICD-10-CM | POA: Insufficient documentation

## 2014-06-17 DIAGNOSIS — Z3202 Encounter for pregnancy test, result negative: Secondary | ICD-10-CM | POA: Insufficient documentation

## 2014-06-17 LAB — BASIC METABOLIC PANEL
Anion gap: 5 (ref 5–15)
BUN: <5 mg/dL — ABNORMAL LOW (ref 6–23)
CO2: 28 mmol/L (ref 19–32)
CREATININE: 0.73 mg/dL (ref 0.50–1.10)
Calcium: 9.5 mg/dL (ref 8.4–10.5)
Chloride: 106 mEq/L (ref 96–112)
Glucose, Bld: 104 mg/dL — ABNORMAL HIGH (ref 70–99)
POTASSIUM: 4.3 mmol/L (ref 3.5–5.1)
Sodium: 139 mmol/L (ref 135–145)

## 2014-06-17 LAB — CBC WITH DIFFERENTIAL/PLATELET
BASOS ABS: 0 10*3/uL (ref 0.0–0.1)
BASOS PCT: 0 % (ref 0–1)
Eosinophils Absolute: 0.1 10*3/uL (ref 0.0–0.7)
Eosinophils Relative: 2 % (ref 0–5)
HCT: 36.7 % (ref 36.0–46.0)
Hemoglobin: 12.3 g/dL (ref 12.0–15.0)
Lymphocytes Relative: 46 % (ref 12–46)
Lymphs Abs: 1.6 10*3/uL (ref 0.7–4.0)
MCH: 29.6 pg (ref 26.0–34.0)
MCHC: 33.5 g/dL (ref 30.0–36.0)
MCV: 88.4 fL (ref 78.0–100.0)
MONOS PCT: 6 % (ref 3–12)
Monocytes Absolute: 0.2 10*3/uL (ref 0.1–1.0)
NEUTROS PCT: 46 % (ref 43–77)
Neutro Abs: 1.6 10*3/uL — ABNORMAL LOW (ref 1.7–7.7)
Platelets: 184 10*3/uL (ref 150–400)
RBC: 4.15 MIL/uL (ref 3.87–5.11)
RDW: 12.6 % (ref 11.5–15.5)
WBC: 3.4 10*3/uL — ABNORMAL LOW (ref 4.0–10.5)

## 2014-06-17 LAB — URINALYSIS, ROUTINE W REFLEX MICROSCOPIC
Bilirubin Urine: NEGATIVE
GLUCOSE, UA: NEGATIVE mg/dL
HGB URINE DIPSTICK: NEGATIVE
KETONES UR: NEGATIVE mg/dL
Leukocytes, UA: NEGATIVE
Nitrite: NEGATIVE
PROTEIN: NEGATIVE mg/dL
Specific Gravity, Urine: 1.012 (ref 1.005–1.030)
Urobilinogen, UA: 0.2 mg/dL (ref 0.0–1.0)
pH: 8 (ref 5.0–8.0)

## 2014-06-17 LAB — POC URINE PREG, ED: Preg Test, Ur: NEGATIVE

## 2014-06-17 MED ORDER — BENZONATATE 100 MG PO CAPS: 100.0000 mg | ORAL_CAPSULE | Freq: Three times a day (TID) | ORAL | Status: DC

## 2014-06-17 NOTE — ED Notes (Signed)
Pt reports having cold symptoms for several days but also wants to be checked due to excessive weight loss over the past year. No acute distress noted at triage.

## 2014-06-17 NOTE — ED Notes (Signed)
Pa Josh at bedside.

## 2014-06-17 NOTE — Discharge Instructions (Signed)
Please read and follow all provided instructions.  Your diagnoses today include:  1. Cough   2. Weight loss   3. Episodic lightheadedness     Tests performed today include:  Blood counts and electrolytes: red blood cells are okay, white blood cells slightly low  Electrolytes and blood sugar - normal  EKG - no signs of significant rhythm problem  Chest x-ray - no pneumonia  Urine test - no infection or pregnancy  Vital signs. See below for your results today.   Medications prescribed:   Tessalon Perles - cough suppressant medication  Take any prescribed medications only as directed.  Home care instructions:  Follow any educational materials contained in this packet.  BE VERY CAREFUL not to take multiple medicines containing Tylenol (also called acetaminophen). Doing so can lead to an overdose which can damage your liver and cause liver failure and possibly death.   Follow-up instructions: Please follow-up with your primary care provider in the next 3 days for further evaluation of your symptoms. See referrals given to you.   Return instructions:   Please return to the Emergency Department if you experience worsening symptoms.   Return if you pass out or have worsening lightheadedness  Return with shortness of breath, fever, cough  Please return if you have any other emergent concerns.  Additional Information:  Your vital signs today were: BP 121/65 mmHg   Pulse 66   Temp(Src) 97.7 F (36.5 C) (Oral)   Resp 12   Ht 5\' 5"  (1.651 m)   Wt 122 lb 12.8 oz (55.702 kg)   BMI 20.44 kg/m2   SpO2 100%   LMP 05/20/2014 If your blood pressure (BP) was elevated above 135/85 this visit, please have this repeated by your doctor within one month. --------------   Emergency Department Resource Guide 1) Find a Doctor and Pay Out of Pocket Although you won't have to find out who is covered by your insurance plan, it is a good idea to ask around and get recommendations. You will  then need to call the office and see if the doctor you have chosen will accept you as a new patient and what types of options they offer for patients who are self-pay. Some doctors offer discounts or will set up payment plans for their patients who do not have insurance, but you will need to ask so you aren't surprised when you get to your appointment.  2) Contact Your Local Health Department Not all health departments have doctors that can see patients for sick visits, but many do, so it is worth a call to see if yours does. If you don't know where your local health department is, you can check in your phone book. The CDC also has a tool to help you locate your state's health department, and many state websites also have listings of all of their local health departments.  3) Find a Walk-in Clinic If your illness is not likely to be very severe or complicated, you may want to try a walk in clinic. These are popping up all over the country in pharmacies, drugstores, and shopping centers. They're usually staffed by nurse practitioners or physician assistants that have been trained to treat common illnesses and complaints. They're usually fairly quick and inexpensive. However, if you have serious medical issues or chronic medical problems, these are probably not your best option.  No Primary Care Doctor: - Call Health Connect at  [phone] - they can help you locate a primary care doctor  that  accepts your insurance, provides certain services, etc. - Physician Referral Service- [phone]  Chronic Pain Problems: Organization         Address  Phone   Notes  [facility]  [phone] Patients need to be referred by their primary care doctor.   Medication Assistance: Organization         Address  Phone   Notes  [facility] [place] Medication [facility] [address] [place], [place] [address] [phone] --Must be a resident of [place] [facility] -- Must have NO  insurance coverage whatsoever (no Medicaid/ Medicare, etc.) -- The pt. MUST have a primary care doctor that directs their care regularly and follows them in the community   MedAssist  [phone]   [employer]  [phone]    Agencies that provide inexpensive medical care: Organization         Address  Phone   Notes  [facility]  [phone]   [facility]    [phone]   [facility] [address] [place], [place] [address] [phone]) [phone]   [facility] [address], [place] [phone]) [phone]   Planned Parenthood    [phone]) [phone]   [facility]    [phone]) [phone]   [facility]  [address], [place] Phone:  [phone], Fax:  [phone]) [phone] Hours of Operation:  9 am - 6 pm, M-F.  Also accepts Medicaid/Medicare and self-pay.  [facility]  [address], [place] Phone: [phone]) [phone], Fax: [phone]. Hours of Operation:  8:30 am - 5:30 pm, M-F.  Also accepts Medicaid and self-pay.  [facility] [place] [address], [place] Phone: [phone]) [phone]   [facility] [address], [place], [place] [phone], Ext. [phone] Mondays & Thursdays: 7-9 AM.  First 15 patients are seen on a first come, first serve basis.    Medicaid-accepting [facility] [place] Providers:  Organization         Address  Phone   Notes  [facility] [address], [place] [phone] Also accepts self-pay patients.  [facility] [address], [place]  [phone]) [phone]   [facility] [address], [place] [phone]   [facility] [address] [place] [address], [place] [phone]) [phone]   Renaye RakersVeita Bland [address], [place]   [phone]) [phone] Only accepts [place] Access [place] patients after they have  their name applied to their card.   Self-Pay (no insurance) in [facility] [place]:  Organization         Address  Phone   Notes  Sickle Cell Patients, [facility] [address], [place] [phone]   [facility] [address], [place] [phone]   [facility] [place]  [address], [place] [phone]   [facility]/Dr. Osei-Bonsu  [address] [place] [address], [place] or [phone], [place] [phone] Phone number for both [place] and [place] locations is the same.  [facility] [address], [place] [phone]   [facility] [place] [address] [place] [address], [place] or [address] [phone]  [facility] [place] [phone], phone; [phone], fax Sees patients 1st and 3rd Saturday of every month.  Must not qualify for public or private insurance (i.e. Medicaid, Medicare, [place] Health Choice, Veterans' Benefits)  Household income should be no more than 200% of the poverty level The clinic cannot treat you if you are pregnant or think you are pregnant  Sexually transmitted diseases are not treated at the clinic.    Dental Care: Organization         Address  Phone  Notes  [facility] Department of [facility] [place] [phone] Accepts children up to age 61 who are enrolled in [place] or [place]; pregnant women with a Medicaid card; and children who have applied for Medicaid or [place] Health Choice, but were declined, whose parents can pay a reduced fee at time of service.  [facility] Department of [facility]  [address], [place] [phone] Accepts children up to age 23 who are enrolled in [place] or [place]; pregnant women with a Medicaid card; and children who have applied  for Medicaid or [place] Health Choice, but were declined, whose parents can pay a reduced fee at time of service.  [facility] Access PROGRAM  [place] [phone] Patients are seen by appointment only. Walk-ins are not accepted. [place] will see patients 66 years of age and older. Monday - Tuesday (8am-5pm) Most Wednesdays (8:30-5pm) $30 per visit, cash only  [facility] PRO[facility]  [address], [facility] [phone] Patients are seen by appointment only. Walk-ins are not accepted. [place] will see patients 59 years of age and older. One Wednesday Evening (Monthly: Volunteer Based).  $30 per visit, cash only  [place]  [phone] for adults; Children under age 15, call Graduate Pediatric Dentistry at [phone]. Children aged 65-14, please call [phone] to request a pediatric application.  Dental services are provided in all areas of dental care including fillings, crowns and bridges, complete and partial dentures, implants, gum treatment, root canals, and extractions. Preventive care is also provided. Treatment is provided to both adults and children. Patients are selected via a lottery and there is often a waiting list.   [facility] [address], [place]  [phone] [email or web address]   [facility] [address] [place], [place] [phone], Ext. [phone] Second and Fourth Thursday of each month, opens at 6:30 AM; Clinic ends at 9 AM.  Patients are seen on a first-come first-served basis, and a limited number are seen during each clinic.   [facility]  [address] Hillard Danker [place], [place] [phone]   Eligibility Requirements You must have lived in [place], [place], or [place] counties for at least the last three months.   You cannot be eligible for state or federal sponsored [employer], including [employer],  [place], or [employer].   You generally cannot be eligible for healthcare insurance through your employer.    How to apply: Eligibility screenings are held every Tuesday and Wednesday afternoon from 1:00 pm until 4:00 pm. You do not need an appointment for the interview!  [facility] [address] [place], [place]   [place]  [place] Department  [place]  Department  [phone]    Behavioral Health Resources in the Community: Intensive Outpatient Programs Organization         Address  Phone  Notes  [place] [address] [place], [place] [phone]   [facility] Outpatient [address], [place], [place]   ADS: Alcohol & Drug Svcs [address], [place], [place]   [place] [address],  [place], [place] or [phone]   Substance Abuse Resources Organization         Address  Phone  Notes  Alcohol and Drug Services  [phone]   [place]  [phone]   [facility] [place]   Chinita Pester  [phone]   Residential & Outpatient Substance Abuse Program  [phone]   Psychological Services Organization         Address  Phone  Notes  [facility]  [place]  [phone]   [place] [address] [place] or [phone]    Mobile Crisis Teams Organization         Address  Phone  Notes  [facility], Mobile Crisis Care Unit  [phone]   [facility]  [address] [place], [place]   [place] Levels [address] [place] [phone]    Self-Help/Support Groups Organization         Address  Phone             Notes  [place]. of [place] - variety of support  groups  [place] Call for more information  Narcotics Anonymous (NA), Caring Services [address], [employer] [place]  2 meetings at this location   Special educational needs teacher         Address  Phone  Notes  ASAP Residential Treatment [place],    [place]  [phone]   [facility]  [address] [place] [address]33, [place], [place]   [place], [place] [phone] Admissions: 8am-3pm M-F  Incentives Substance [place] [address]    [place], [place] [phone]   [facility] [address] [place], [place], [place]   [facility] [address],  [place], [place]   [facility] Programs - Intensive Outpatient [address], [place], [place]   [facility] ([place].,  [place], [place] [phone] or [phone]   [facility] (RTS) [address], [place], [place] Accepts Medicaid  [facility] [address],  [place] [phone] Substance Abuse/Addiction Treatment   [facility] Organization         Address  Phone  Notes  [facility]  [phone]   Domenic Schwab, PhD [address] Arlis [place], [place]   [phone] or [phone]   [place], [place] [phone]   [place] [address], [place], [place] [phone] Insurance/Medicaid/sponsorship through [employer] and Families [address] [place]                                    [place], [place] [phone] [place]  [place], [place] [phone]    Dr. Adele Schilder  [phone]   [facility] Dept. 1) [address] [place] 2) [place] 3)   [place] [address], [place] [phone]  [phone]   [place] [phone] or [phone] (After Hours)

## 2014-06-17 NOTE — ED Provider Notes (Signed)
CSN: [id number]     Arrival date & time 06/17/14  1101 History   First MD Initiated Contact with Patient 06/17/14 1113     Chief Complaint  Patient presents with  . URI  . Weight Loss     (Consider location/radiation/quality/duration/timing/severity/associated sxs/prior Treatment) HPI Comments: Patient presents with multiple complaints. Her acute complaint is 3 days of cough. Cough is nonproductive. She does not have chest pain, fever, ear pain, runny nose, or sore throat. Patient denies risk factors for pulmonary embolism including: unilateral leg swelling, history of DVT/PE/other blood clots, use of estrogens, recent immobilizations, recent surgery, recent travel (>4hr segment), malignancy, hemoptysis. Additionally she has had episodes of lightheadedness without syncope over the past year. Last episode was yesterday. Patient states that she stood up to go use the restroom and felt extremely lightheaded and needed to sit down again. She endorses very heavy bleeding during her menstrual periods. Bleeding waxes and wanes over approximately 2 weeks and is heavy at times. Her periods have been like this since she delivered her son approximately one year ago. Additionally, patient reports approximately 30 pound weight loss since her son was born. She states that her weight was stable at 150 pounds in the past, now her weight is 120 pounds. She has not been trying to lose weight. She reports decreased appetite. No nausea, vomiting, abdominal pain, diarrhea. No urinary symptoms. She does not have any headaches or vision change. She does not have PCP.     The history is provided by the patient.    Past Medical History  Diagnosis Date  . Asthma     inhaler prn  . Chlamydia    Past Surgical History  Procedure Laterality Date  . Tonsillectomy    . Umbilical hernia repair      > 10 yrs ago   . Knee dislocation surgery  2009    left  . Hernia repair    . Cesarean section N/A 05/02/2013     Procedure: Primary Ceasrean Section Delivery Baby Boy @ 0135, Apgars 7/9;  Surgeon: Adam PhenixJames G Arnold, MD;  Location: [facility];  Service: Obstetrics;  Laterality: N/A;   Family History  Problem Relation Age of Onset  . Anesthesia problems Neg Hx   . Hypotension Neg Hx   . Malignant hyperthermia Neg Hx   . Pseudochol deficiency Neg Hx   . Alcohol abuse Neg Hx   . Arthritis Neg Hx   . Birth defects Neg Hx   . COPD Neg Hx   . Cancer Neg Hx   . Depression Neg Hx   . Drug abuse Neg Hx   . Early death Neg Hx   . Hearing loss Neg Hx   . Heart disease Neg Hx   . Hyperlipidemia Neg Hx   . Hypertension Neg Hx   . Kidney disease Neg Hx   . Learning disabilities Neg Hx   . Mental illness Neg Hx   . Mental retardation Neg Hx   . Miscarriages / Stillbirths Neg Hx   . Stroke Neg Hx   . Vision loss Neg Hx   . Asthma Maternal Grandmother   . Diabetes Maternal Grandfather    History  Substance Use Topics  . Smoking status: Former Smoker    Quit date: 08/21/2010  . Smokeless tobacco: Never Used  . Alcohol Use: No   OB History    Gravida Para Term Preterm AB TAB SAB Ectopic Multiple Living   2 2 2  0 0 0 0  0 0 2     Review of Systems  All other systems reviewed and are negative.   Allergies  Review of patient's allergies indicates no known allergies.  Home Medications   Prior to Admission medications   Medication Sig Start Date End Date Taking? Authorizing Provider  albuterol (PROVENTIL HFA;VENTOLIN HFA) 108 (90 BASE) MCG/ACT inhaler Inhale 2 puffs into the lungs every 6 (six) hours as needed for wheezing or shortness of breath.    Historical Provider, MD  clindamycin (CLEOCIN) 300 MG capsule Take 1 capsule (300 mg total) by mouth 3 (three) times daily. 06/16/13   Minta Balsam, MD  ibuprofen (ADVIL,MOTRIN) 600 MG tablet Take 1 tablet (600 mg total) by mouth every 6 (six) hours as needed for mild pain, moderate pain or cramping. 05/04/13   Marge Duncans, CNM   oxyCODONE-acetaminophen (PERCOCET/ROXICET) 5-325 MG per tablet Take 1-2 tablets by mouth every 4 (four) hours as needed for severe pain (moderate - severe pain). 05/04/13   Marge Duncans, CNM  Prenatal Vit-Fe Fumarate-FA (PRENATAL MULTIVITAMIN) TABS tablet Take 1 tablet by mouth daily at 12 noon.    Historical Provider, MD   BP 113/75 mmHg  Pulse 91  Temp(Src) 97.7 F (36.5 C) (Oral)  Resp 18  Ht 5\' 5"  (1.651 m)  Wt 122 lb 12.8 oz (55.702 kg)  BMI 20.44 kg/m2  SpO2 100%  LMP 05/20/2014   Physical Exam  Constitutional: She appears well-developed and well-nourished.  HENT:  Head: Normocephalic and atraumatic.  Nose: Nose normal.  Mouth/Throat: Oropharynx is clear and moist. No oropharyngeal exudate.  Eyes: Pupils are equal, round, and reactive to light. Right eye exhibits no discharge. Left eye exhibits no discharge.  Mild pallor of conjunctiva  Neck: Normal range of motion. Neck supple.  Cardiovascular: Normal rate, regular rhythm and normal heart sounds.   No murmur heard. Pulmonary/Chest: Effort normal and breath sounds normal. No respiratory distress. She has no wheezes. She has no rales.  Abdominal: Soft. There is no tenderness. There is no rebound and no guarding.  Musculoskeletal: Normal range of motion. She exhibits no edema or tenderness.  Neurological: She is alert.  Skin: Skin is warm and dry.  Psychiatric: She has a normal mood and affect.  Nursing note and vitals reviewed.   ED Course  Procedures (including critical care time) Labs Review Labs Reviewed  CBC WITH DIFFERENTIAL - Abnormal; Notable for the following:    WBC 3.4 (*)    Neutro Abs 1.6 (*)    All other components within normal limits  BASIC METABOLIC PANEL - Abnormal; Notable for the following:    Glucose, Bld 104 (*)    BUN <5 (*)    All other components within normal limits  URINALYSIS, ROUTINE W REFLEX MICROSCOPIC  POC URINE PREG, ED    Imaging Review Dg Chest 2 View  06/17/2014    CLINICAL DATA:  Cough.  EXAM: CHEST  2 VIEW  COMPARISON:  Nov 01, 2007.  FINDINGS: The heart size and mediastinal contours are within normal limits. Both lungs are clear. No pneumothorax or pleural effusion is noted. The visualized skeletal structures are unremarkable.  IMPRESSION: No acute cardiopulmonary abnormality seen.   Electronically Signed   By: Roque Lias M.D.   On: 06/17/2014 12:48     EKG Interpretation   Date/Time:  Wednesday June 17 2014 11:50:56 EST Ventricular Rate:  66 PR Interval:  105 QRS Duration: 79 QT Interval:  413 QTC Calculation: 433 R Axis:  77 Text Interpretation:  Sinus rhythm Short PR interval PVCs resolved   Confirmed by YAO  MD, DAVID ([id number]) on 06/17/2014 12:22:14 PM       11:42 AM Patient seen and examined. Work-up initiated.   Vital signs reviewed and are as follows: BP 113/75 mmHg  Pulse 91  Temp(Src) 97.7 F (36.5 C) (Oral)  Resp 18  Ht 5\' 5"  (1.651 m)  Wt 122 lb 12.8 oz (55.702 kg)  BMI 20.44 kg/m2  SpO2 100%  LMP 05/20/2014  1:21 PM Pt informed of results. Will give tessalon for cough. Otherwise she will need to f/u with PCP.   Referrals and resource guide given.   Pt and family at bedside instructed to return with full syncope, chest pain, SOB, fever. Patient verbalizes understanding and agrees with plan.    MDM   Final diagnoses:  Cough  Weight loss  Episodic lightheadedness   Cough: Suspect bronchitis. CXR is neg. No CP/SOB or other risks for PE.   Lightheadedness: Mild orthostasis, no full syncope. EKG with slightly short PR but no delta waves. No prolonged QT, brugada. No concerning family history. Symptoms are with standing. RBC nml despite reportedly heavy periods.   Weight loss: no concerning findings on basic labs except for mild leukopenia. She will need PCP f/u for further evaluation.   No dangerous or life-threatening conditions suspected or identified by history, physical exam, and by work-up. No  indications for hospitalization identified.      Renne CriglerJoshua Azalea Cedar, PA-C 06/17/14 1326  Richardean Canalavid H Yao, MD 06/17/14 [phone]

## 2014-06-30 ENCOUNTER — Emergency Department (HOSPITAL_COMMUNITY)
Admission: EM | Admit: 2014-06-30 | Discharge: 2014-06-30 | Disposition: A | Discharge status: Home / Self Care | Payer: Medicaid Other | Attending: Emergency Medicine | Admitting: Emergency Medicine

## 2014-06-30 ENCOUNTER — Encounter (HOSPITAL_COMMUNITY): Payer: Self-pay | Admitting: *Deleted

## 2014-06-30 DIAGNOSIS — Z98811 Dental restoration status: Secondary | ICD-10-CM | POA: Insufficient documentation

## 2014-06-30 DIAGNOSIS — K0889 Other specified disorders of teeth and supporting structures: Secondary | ICD-10-CM

## 2014-06-30 DIAGNOSIS — Z79899 Other long term (current) drug therapy: Secondary | ICD-10-CM | POA: Insufficient documentation

## 2014-06-30 DIAGNOSIS — Z87891 Personal history of nicotine dependence: Secondary | ICD-10-CM | POA: Insufficient documentation

## 2014-06-30 DIAGNOSIS — K029 Dental caries, unspecified: Secondary | ICD-10-CM | POA: Insufficient documentation

## 2014-06-30 DIAGNOSIS — K088 Other specified disorders of teeth and supporting structures: Secondary | ICD-10-CM | POA: Insufficient documentation

## 2014-06-30 DIAGNOSIS — Z8619 Personal history of other infectious and parasitic diseases: Secondary | ICD-10-CM | POA: Insufficient documentation

## 2014-06-30 DIAGNOSIS — J45909 Unspecified asthma, uncomplicated: Secondary | ICD-10-CM | POA: Insufficient documentation

## 2014-06-30 MED ORDER — PENICILLIN V POTASSIUM 500 MG PO TABS: 500.0000 mg | ORAL_TABLET | Freq: Four times a day (QID) | ORAL | Status: DC

## 2014-06-30 MED ORDER — NAPROXEN 500 MG PO TABS: 500.0000 mg | ORAL_TABLET | Freq: Two times a day (BID) | ORAL | Status: DC

## 2014-06-30 MED ORDER — NAPROXEN 250 MG PO TABS
500.0000 mg | ORAL_TABLET | Freq: Once | ORAL | Status: AC
  Administered 2014-06-30: 500 mg via ORAL
  Filled 2014-06-30: qty 2

## 2014-06-30 NOTE — ED Notes (Signed)
Pt c/o of left sided dental pain-

## 2014-06-30 NOTE — Discharge Instructions (Signed)

## 2014-06-30 NOTE — ED Provider Notes (Signed)
CSN: [id number]     Arrival date & time 06/30/14  4098 History   First MD Initiated Contact with Patient 06/30/14 [phone]     Chief Complaint  Patient presents with  . Dental Pain     (Consider location/radiation/quality/duration/timing/severity/associated sxs/prior Treatment) HPI Comments: 24 year old female, dental pain for 1 week, left upper and left lower, worse with chewing, not associated with swelling fevers chills nausea vomiting cough shortness of breath or chest pain. Has had similar symptoms in the past intermittently  Patient is a 24 y.o. female presenting with tooth pain. The history is provided by the patient.  Dental Pain Associated symptoms: no facial swelling and no fever     Past Medical History  Diagnosis Date  . Asthma     inhaler prn  . Chlamydia    Past Surgical History  Procedure Laterality Date  . Tonsillectomy    . Umbilical hernia repair      > 10 yrs ago   . Knee dislocation surgery  2009    left  . Hernia repair    . Cesarean section N/A 05/02/2013    Procedure: Primary Ceasrean Section Delivery Baby Boy @ 0135, Apgars 7/9;  Surgeon: Adam Phenix, MD;  Location: [facility];  Service: Obstetrics;  Laterality: N/A;   Family History  Problem Relation Age of Onset  . Anesthesia problems Neg Hx   . Hypotension Neg Hx   . Malignant hyperthermia Neg Hx   . Pseudochol deficiency Neg Hx   . Alcohol abuse Neg Hx   . Arthritis Neg Hx   . Birth defects Neg Hx   . COPD Neg Hx   . Cancer Neg Hx   . Depression Neg Hx   . Drug abuse Neg Hx   . Early death Neg Hx   . Hearing loss Neg Hx   . Heart disease Neg Hx   . Hyperlipidemia Neg Hx   . Hypertension Neg Hx   . Kidney disease Neg Hx   . Learning disabilities Neg Hx   . Mental illness Neg Hx   . Mental retardation Neg Hx   . Miscarriages / Stillbirths Neg Hx   . Stroke Neg Hx   . Vision loss Neg Hx   . Asthma Maternal Grandmother   . Diabetes Maternal Grandfather    History  Substance Use Topics   . Smoking status: Former Smoker    Quit date: 08/21/2010  . Smokeless tobacco: Never Used  . Alcohol Use: No   OB History    Gravida Para Term Preterm AB TAB SAB Ectopic Multiple Living   0 0 0 0 0 0 2     Review of Systems  Constitutional: Negative for fever and chills.  HENT: Positive for dental problem. Negative for facial swelling, sore throat, trouble swallowing and voice change.        Toothache  Gastrointestinal: Negative for nausea and vomiting.      Allergies  Review of patient's allergies indicates no known allergies.  Home Medications   Prior to Admission medications   Medication Sig Start Date End Date Taking? Authorizing Provider  albuterol (PROVENTIL HFA;VENTOLIN HFA) 108 (90 BASE) MCG/ACT inhaler Inhale 2 puffs into the lungs every 6 (six) hours as needed for wheezing or shortness of breath.    Historical Provider, MD  benzonatate (TESSALON) 100 MG capsule Take 1 capsule (100 mg total) by mouth every 8 (eight) hours. 06/17/14   Renne Crigler, PA-C  naproxen (NAPROSYN) 500 MG  tablet Take 1 tablet (500 mg total) by mouth 2 (two) times daily with a meal. 06/30/14   Vida RollerBrian D Onyx Schirmer, MD  penicillin v potassium (VEETID) 500 MG tablet Take 1 tablet (500 mg total) by mouth 4 (four) times daily. 06/30/14   Vida RollerBrian D Kashmere Daywalt, MD  Prenatal Vit-Fe Fumarate-FA (PRENATAL MULTIVITAMIN) TABS tablet Take 1 tablet by mouth daily at 12 noon.    Historical Provider, MD   BP 114/70 mmHg  Pulse 65  Temp(Src) 98.2 F (36.8 C) (Oral)  Resp 16  Ht 5\' 5"  (1.651 m)  Wt 122 lb (55.339 kg)  BMI 20.30 kg/m2  SpO2 100%  LMP 05/20/2014 (Within Days) Physical Exam  Constitutional: She appears well-developed and well-nourished. No distress.  HENT:  Head: Normocephalic and atraumatic.  Mouth/Throat: Oropharynx is clear and moist. No oropharyngeal exudate.  Dental Disease - no obvious swelling abscesses fractures missing teeth though there are some caries, some fillings are present, no  tenderness under the tongue  Eyes: Conjunctivae are normal. No scleral icterus.  Neck: Normal range of motion. Neck supple. No thyromegaly present.  No lymphadenopathy of the neck, no trismus or torticollis  Cardiovascular: Normal rate and regular rhythm.   Pulmonary/Chest: Effort normal and breath sounds normal.  Lymphadenopathy:    She has no cervical adenopathy.  Neurological: She is alert.  Skin: Skin is warm and dry. No rash noted. She is not diaphoretic.  Nursing note and vitals reviewed.   ED Course  Procedures (including critical care time) Labs Review Labs Reviewed - No data to display  Imaging Review No results found.    MDM   Final diagnoses:  Pain, dental    Well-appearing, no signs of abscess, vital signs normal, antibiotics, NSAIDs, follow-up with dentist. Patient in agreement with the plan.  Meds given in ED:  Medications  naproxen (NAPROSYN) tablet 500 mg (not administered)    New Prescriptions   NAPROXEN (NAPROSYN) 500 MG TABLET    Take 1 tablet (500 mg total) by mouth 2 (two) times daily with a meal.   PENICILLIN V POTASSIUM (VEETID) 500 MG TABLET    Take 1 tablet (500 mg total) by mouth 4 (four) times daily.        Vida RollerBrian D Joely Losier, MD 06/30/14 [phone]

## 2014-06-30 NOTE — ED Notes (Signed)
PT ambulated with baseline gait; VSS; A&Ox3; no signs of distress; respirations even and unlabored; skin warm and dry; no questions upon discharge.  

## 2014-11-20 ENCOUNTER — Encounter (HOSPITAL_COMMUNITY): Payer: Self-pay | Admitting: *Deleted

## 2014-11-20 ENCOUNTER — Emergency Department (HOSPITAL_COMMUNITY): Payer: Medicaid Other

## 2014-11-20 ENCOUNTER — Emergency Department (HOSPITAL_COMMUNITY)
Admission: EM | Admit: 2014-11-20 | Discharge: 2014-11-20 | Disposition: A | Discharge status: Home / Self Care | Payer: Medicaid Other | Attending: Emergency Medicine | Admitting: Emergency Medicine

## 2014-11-20 DIAGNOSIS — Z791 Long term (current) use of non-steroidal anti-inflammatories (NSAID): Secondary | ICD-10-CM | POA: Insufficient documentation

## 2014-11-20 DIAGNOSIS — W231XXA Caught, crushed, jammed, or pinched between stationary objects, initial encounter: Secondary | ICD-10-CM | POA: Diagnosis not present

## 2014-11-20 DIAGNOSIS — Y998 Other external cause status: Secondary | ICD-10-CM | POA: Diagnosis not present

## 2014-11-20 DIAGNOSIS — S61212A Laceration without foreign body of right middle finger without damage to nail, initial encounter: Secondary | ICD-10-CM | POA: Insufficient documentation

## 2014-11-20 DIAGNOSIS — Y9389 Activity, other specified: Secondary | ICD-10-CM | POA: Insufficient documentation

## 2014-11-20 DIAGNOSIS — Z87891 Personal history of nicotine dependence: Secondary | ICD-10-CM | POA: Diagnosis not present

## 2014-11-20 DIAGNOSIS — J45909 Unspecified asthma, uncomplicated: Secondary | ICD-10-CM | POA: Diagnosis not present

## 2014-11-20 DIAGNOSIS — Y92009 Unspecified place in unspecified non-institutional (private) residence as the place of occurrence of the external cause: Secondary | ICD-10-CM | POA: Insufficient documentation

## 2014-11-20 DIAGNOSIS — Z79899 Other long term (current) drug therapy: Secondary | ICD-10-CM | POA: Diagnosis not present

## 2014-11-20 DIAGNOSIS — S6991XA Unspecified injury of right wrist, hand and finger(s), initial encounter: Secondary | ICD-10-CM | POA: Diagnosis present

## 2014-11-20 DIAGNOSIS — Z792 Long term (current) use of antibiotics: Secondary | ICD-10-CM | POA: Insufficient documentation

## 2014-11-20 DIAGNOSIS — Z8619 Personal history of other infectious and parasitic diseases: Secondary | ICD-10-CM | POA: Diagnosis not present

## 2014-11-20 DIAGNOSIS — S61219A Laceration without foreign body of unspecified finger without damage to nail, initial encounter: Secondary | ICD-10-CM

## 2014-11-20 MED ORDER — LIDOCAINE HCL 2 % IJ SOLN: 5.0000 mL | Freq: Once | INTRAMUSCULAR | Status: DC

## 2014-11-20 MED ORDER — BACITRACIN 500 UNIT/GM EX OINT
1.0000 "application " | TOPICAL_OINTMENT | Freq: Two times a day (BID) | CUTANEOUS | Status: DC
  Filled 2014-11-20: qty 0.9

## 2014-11-20 MED ORDER — HYDROMORPHONE HCL 1 MG/ML IJ SOLN
0.5000 mg | Freq: Once | INTRAMUSCULAR | Status: AC
  Administered 2014-11-20: 0.5 mg via INTRAMUSCULAR
  Filled 2014-11-20: qty 1

## 2014-11-20 NOTE — ED Provider Notes (Signed)
CSN: [id number]     Arrival date & time 11/20/14  1503 History  This chart was scribed for non-physician practitioner, Catha GosselinHanna Patel-Mills, PA-C working with Toy CookeyMegan Docherty, MD by Doreatha MartinEva Mathews, ED scribe. This patient was seen in room TR09C/TR09C and the patient's care was started at 3:37 PM    Chief Complaint  Patient presents with  . Finger Injury   The history is provided by the patient. No language interpreter was used.    HPI Comments: Dana Mcconnell is a 24 y.o. female who presents to the Emergency Department complaining of a right middle and 4th finger injury that occurred at 1400. Pt states moderate, constant pain and abrasion to the middle finger as associated symptoms. She states that she accidentally slammed her right hand in a door at home. No OTC medications tried for pain. She denies numbness and vomiting as associated symptoms.   Past Medical History  Diagnosis Date  . Asthma     inhaler prn  . Chlamydia    Past Surgical History  Procedure Laterality Date  . Tonsillectomy    . Umbilical hernia repair      > 10 yrs ago   . Knee dislocation surgery  2009    left  . Hernia repair    . Cesarean section N/A 05/02/2013    Procedure: Primary Ceasrean Section Delivery Baby Boy @ 0135, Apgars 7/9;  Surgeon: Adam PhenixJames G Arnold, MD;  Location: [facility];  Service: Obstetrics;  Laterality: N/A;   Family History  Problem Relation Age of Onset  . Anesthesia problems Neg Hx   . Hypotension Neg Hx   . Malignant hyperthermia Neg Hx   . Pseudochol deficiency Neg Hx   . Alcohol abuse Neg Hx   . Arthritis Neg Hx   . Birth defects Neg Hx   . COPD Neg Hx   . Cancer Neg Hx   . Depression Neg Hx   . Drug abuse Neg Hx   . Early death Neg Hx   . Hearing loss Neg Hx   . Heart disease Neg Hx   . Hyperlipidemia Neg Hx   . Hypertension Neg Hx   . Kidney disease Neg Hx   . Learning disabilities Neg Hx   . Mental illness Neg Hx   . Mental retardation Neg Hx   . Miscarriages / Stillbirths Neg  Hx   . Stroke Neg Hx   . Vision loss Neg Hx   . Asthma Maternal Grandmother   . Diabetes Maternal Grandfather    History  Substance Use Topics  . Smoking status: Former Smoker    Quit date: 08/21/2010  . Smokeless tobacco: Never Used  . Alcohol Use: No   OB History    Gravida Para Term Preterm AB TAB SAB Ectopic Multiple Living   2 2 2  0 0 0 0 0 0 2     Review of Systems  Gastrointestinal: Negative for vomiting.  Musculoskeletal: Positive for arthralgias.  Skin: Positive for wound.  Neurological: Negative for numbness.      Allergies  Review of patient's allergies indicates no known allergies.  Home Medications   Prior to Admission medications   Medication Sig Start Date End Date Taking? Authorizing Provider  albuterol (PROVENTIL HFA;VENTOLIN HFA) 108 (90 BASE) MCG/ACT inhaler Inhale 2 puffs into the lungs every 6 (six) hours as needed for wheezing or shortness of breath.    Historical Provider, MD  benzonatate (TESSALON) 100 MG capsule Take 1 capsule (100 mg total) by  mouth every 8 (eight) hours. 06/17/14   Renne Crigler, PA-C  naproxen (NAPROSYN) 500 MG tablet Take 1 tablet (500 mg total) by mouth 2 (two) times daily with a meal. 06/30/14   Eber Hong, MD  penicillin v potassium (VEETID) 500 MG tablet Take 1 tablet (500 mg total) by mouth 4 (four) times daily. 06/30/14   Eber Hong, MD  Prenatal Vit-Fe Fumarate-FA (PRENATAL MULTIVITAMIN) TABS tablet Take 1 tablet by mouth daily at 12 noon.    Historical Provider, MD   Triage VS: BP 102/61 mmHg  Pulse 69  Temp(Src) 98.6 F (37 C)  Resp 18  Ht  (1.651 m)  Wt 141 lb (63.957 kg)  BMI 23.46 kg/m2  SpO2 100%  LMP 10/25/2014 Physical Exam  Constitutional: She is oriented to person, place, and time. She appears well-developed and well-nourished. No distress.  HENT:  Head: Normocephalic and atraumatic.  Mouth/Throat: Oropharynx is clear and moist.  Eyes: Conjunctivae and EOM are normal. Pupils are equal, round,  and reactive to light.  Neck: Normal range of motion. Neck supple. No tracheal deviation present.  Cardiovascular: Normal rate and normal heart sounds.  Exam reveals no gallop and no friction rub.   No murmur heard. Pulmonary/Chest: Breath sounds normal. No respiratory distress.  Abdominal: Soft. She exhibits no distension. There is no tenderness. There is no rebound and no guarding.  Musculoskeletal: Normal range of motion. She exhibits no edema or tenderness.  Swelling to third finger of right hand with TTP of the proximal, middle and distal phalanx. FROM of wrist. She is able to flex and extend all of her fingers. She has good sensation. Good radial pulse.   Neurological: She is alert and oriented to person, place, and time.  Skin: Skin is warm and dry.  Psychiatric: She has a normal mood and affect. Her behavior is normal.  Nursing note and vitals reviewed.   ED Course  Procedures (including critical care time) DIAGNOSTIC STUDIES: Oxygen Saturation is 100% on RA, normal by my interpretation.    COORDINATION OF CARE: 3:41 PM Discussed treatment plan with pt at bedside and pt agreed to plan.  LACERATION REPAIR Performed by: Catha Gosselin Authorized by: Catha Gosselin Consent: Verbal consent obtained. Risks and benefits: risks, benefits and alternatives were discussed Consent given by: patient Patient identity confirmed: provided demographic data Prepped and Draped in normal sterile fashion Wound explored Laceration Location: Middle right finger along the proximal middle phalange and not over the joint. Laceration Length: 1.5 cm No Foreign Bodies seen or palpated Anesthesia: digital block Local anesthetic: lidocaine 2% without epinephrine Anesthetic total: 6 ml Irrigation method: syringe Amount of cleaning: standard Skin closure: 4.0 prolene Number of sutures: 3 Technique: single interrupted Note: I could not visualize any tendons and the skin was superficial.   There was minimal bleeding. Patient had full ROM of fingers.  Patient tolerance: Patient tolerated the procedure well with no immediate complications.  Labs Review Labs Reviewed - No data to display  Imaging Review Dg Hand Complete Right  11/20/2014   CLINICAL DATA:  Finger injury. Patient caught middle and ringer finger in wooden door today. Initial encounter.  EXAM: RIGHT HAND - COMPLETE 3+ VIEW  COMPARISON:  None.  FINDINGS: No fracture or dislocation. Joint spaces are preserved. No erosions. Regional soft tissues appear normal. No radiopaque foreign body.  IMPRESSION: No fracture, dislocation or radiopaque foreign body.   Electronically Signed   By: Simonne Come M.D.   On: 11/20/2014 16:13  EKG Interpretation None      MDM   Final diagnoses:  Laceration of finger, right, initial encounter  Patient presents for middle right finger laceration after slamming it in the door. The xray of the right hand is negative for fracture or dislocation.   I was able to suture the wound and the patient tolerated the procedure well.  She can return in 7-10 days or go to an urgent care to have the sutures removed.  I explained that the wound would need to be kept dry and clean.  Patient verbally agrees with the plan.    Catha Gosselin, PA-C 11/21/14 1155  Toy Cookey, MD 11/23/14 1540

## 2014-11-20 NOTE — ED Notes (Signed)
She caught her rt middle and ring fingers in a house door earlier today.  Abrasion to the middle finger.  Painful lmp may

## 2014-11-20 NOTE — Discharge Instructions (Signed)
Laceration Care, Adult Have sutures removed in 7-10 by your pcp. Take tylenol or motrin for pain. A laceration is a cut or lesion that goes through all layers of the skin and into the tissue just beneath the skin. TREATMENT  Some lacerations may not require closure. Some lacerations may not be able to be closed due to an increased risk of infection. It is important to see your caregiver as soon as possible after an injury to minimize the risk of infection and maximize the opportunity for successful closure. If closure is appropriate, pain medicines may be given, if needed. The wound will be cleaned to help prevent infection. Your caregiver will use stitches (sutures), staples, wound glue (adhesive), or skin adhesive strips to repair the laceration. These tools bring the skin edges together to allow for faster healing and a better cosmetic outcome. However, all wounds will heal with a scar. Once the wound has healed, scarring can be minimized by covering the wound with sunscreen during the day for 1 full year. HOME CARE INSTRUCTIONS  For sutures or staples:  Keep the wound clean and dry.  If you were given a bandage (dressing), you should change it at least once a day. Also, change the dressing if it becomes wet or dirty, or as directed by your caregiver.  Wash the wound with soap and water 2 times a day. Rinse the wound off with water to remove all soap. Pat the wound dry with a clean towel.  After cleaning, apply a thin layer of the antibiotic ointment as recommended by your caregiver. This will help prevent infection and keep the dressing from sticking.  You may shower as usual after the first 24 hours. Do not soak the wound in water until the sutures are removed.  Only take over-the-counter or prescription medicines for pain, discomfort, or fever as directed by your caregiver.  Get your sutures or staples removed as directed by your caregiver. For skin adhesive strips:  Keep the wound  clean and dry.  Do not get the skin adhesive strips wet. You may bathe carefully, using caution to keep the wound dry.  If the wound gets wet, pat it dry with a clean towel.  Skin adhesive strips will fall off on their own. You may trim the strips as the wound heals. Do not remove skin adhesive strips that are still stuck to the wound. They will fall off in time. For wound adhesive:  You may briefly wet your wound in the shower or bath. Do not soak or scrub the wound. Do not swim. Avoid periods of heavy perspiration until the skin adhesive has fallen off on its own. After showering or bathing, gently pat the wound dry with a clean towel.  Do not apply liquid medicine, cream medicine, or ointment medicine to your wound while the skin adhesive is in place. This may loosen the film before your wound is healed.  If a dressing is placed over the wound, be careful not to apply tape directly over the skin adhesive. This may cause the adhesive to be pulled off before the wound is healed.  Avoid prolonged exposure to sunlight or tanning lamps while the skin adhesive is in place. Exposure to ultraviolet light in the first year will darken the scar.  The skin adhesive will usually remain in place for 5 to 10 days, then naturally fall off the skin. Do not pick at the adhesive film. You may need a tetanus shot if:  You cannot remember  when you had your last tetanus shot.  You have never had a tetanus shot. If you get a tetanus shot, your arm may swell, get red, and feel warm to the touch. This is common and not a problem. If you need a tetanus shot and you choose not to have one, there is a rare chance of getting tetanus. Sickness from tetanus can be serious. SEEK MEDICAL CARE IF:   You have redness, swelling, or increasing pain in the wound.  You see a red line that goes away from the wound.  You have yellowish-white fluid (pus) coming from the wound.  You have a fever.  You notice a bad smell  coming from the wound or dressing.  Your wound breaks open before or after sutures have been removed.  You notice something coming out of the wound such as wood or glass.  Your wound is on your hand or foot and you cannot move a finger or toe. SEEK IMMEDIATE MEDICAL CARE IF:   Your pain is not controlled with prescribed medicine.  You have severe swelling around the wound causing pain and numbness or a change in color in your arm, hand, leg, or foot.  Your wound splits open and starts bleeding.  You have worsening numbness, weakness, or loss of function of any joint around or beyond the wound.  You develop painful lumps near the wound or on the skin anywhere on your body. MAKE SURE YOU:   Understand these instructions.  Will watch your condition.  Will get help right away if you are not doing well or get worse. Document Released: 06/05/2005 Document Revised: 08/28/2011 Document Reviewed: 11/29/2010 [facility] Patient Information 2015 [place], [place]. This information is not intended to replace advice given to you by your health care provider. Make sure you discuss any questions you have with your health care provider.  Emergency Department Resource Guide 1) Find a Doctor and Pay Out of Pocket Although you won't have to find out who is covered by your insurance plan, it is a good idea to ask around and get recommendations. You will then need to call the office and see if the doctor you have chosen will accept you as a new patient and what types of options they offer for patients who are self-pay. Some doctors offer discounts or will set up payment plans for their patients who do not have insurance, but you will need to ask so you aren't surprised when you get to your appointment.  2) Contact Your Local Health Department Not all health departments have doctors that can see patients for sick visits, but many do, so it is worth a call to see if yours does. If you don't know where your  local health department is, you can check in your phone book. The CDC also has a tool to help you locate your state's health department, and many state websites also have listings of all of their local health departments.  3) Find a Walk-in Clinic If your illness is not likely to be very severe or complicated, you may want to try a walk in clinic. These are popping up all over the country in pharmacies, drugstores, and shopping centers. They're usually staffed by nurse practitioners or physician assistants that have been trained to treat common illnesses and complaints. They're usually fairly quick and inexpensive. However, if you have serious medical issues or chronic medical problems, these are probably not your best option.  No Primary Care Doctor: - Call Health Connect  at  [phone] - they can help you locate a primary care doctor that  accepts your insurance, provides certain services, etc. - Physician Referral Service- [phone]  Chronic Pain Problems: Organization         Address  Phone   Notes  [facility]  [phone] Patients need to be referred by their primary care doctor.   Medication Assistance: Organization         Address  Phone   Notes  [facility] Medication [facility] [address] [place] [place], [place] [address] [phone] --Must be a resident of [facility] -- Must have NO insurance coverage whatsoever (no Medicaid/ Medicare, etc.) -- The pt. MUST have a primary care doctor that directs their care regularly and follows them in the community   [facility]ist  [phone]   [employer]  [phone]    Agencies that provide inexpensive medical care: Organization         Address  Phone   Notes  [facility]  [phone]   [facility]    [phone]   [facility] [address] [place], [place] [address] [phone]   [facility] [address] [place]  [address] [place] [phone]   Planned Parenthood    [phone]   [facility]    [phone]   [facility]  [address], [place] Phone:  [phone], Fax:  [phone] Hours of Operation:  9 am - 6 pm, M-F.  Also accepts Medicaid/Medicare and self-pay.  [facility]  [address], [place] Phone: [phone], Fax: [phone]. Hours of Operation:  8:30 am - 5:30 pm, M-F.  Also accepts Medicaid and self-pay.  [facility] [place] [address] [place] Phone: [phone]   [facility] [address] Natasha Bence [place], [place] [phone], Ext. [phone] Mondays & Thursdays: 7-9 AM.  First 15 patients are seen on a first come, first serve basis.    Medicaid-accepting [facility] Providers:  Organization         Address  Phone   Notes  [facility] [address], [place] [phone] Also accepts self-pay patients.  [facility] [address] Laurell Josephs [place], [place]  [phone]   [facility] [address], [place] [phone]   [facility] [address], [place] [phone]   Renaye Rakers [address], [place]   [phone] Only accepts [place] patients after they have their name applied to their card.   Self-Pay (no insurance) in [facility]:  Organization         Address  Phone   Notes  Sickle Cell Patients, [facility] [address] [place], [place] [phone]   [facility] Urgent Care [address] [place], [place] [phone]   [facility] [place]  [address], [place] [phone]   [facility]/Dr. Osei-Bonsu  [address] [place] or [address], [place] [phone] Phone number for both [place] and [place] locations is the same.  [facility] [address] [place] Otto [phone]   [facility] [address], [place] or [address] [phone]   [facility] [address] [place], [place] [phone], phone; [phone], fax Sees patients 1st and 3rd Saturday of every month.  Must not qualify for public or private insurance (i.e. Medicaid, Medicare, [place] Health Choice, Veterans' Benefits)  Household income should be no more than 200% of the poverty level The clinic cannot treat you if you are pregnant or think you are pregnant  Sexually transmitted diseases are not treated at the clinic.    Dental Care: Organization         Address  Phone  Notes  [facility] Department of [facility] [address] [place], [place] [phone] Accepts children up to age 19 who are enrolled in [place] or [place] Health Choice; pregnant women with a Medicaid card; and children who have applied for Medicaid or [place] Health Choice, but were declined, whose parents can pay a reduced fee at time of service.  [facility] Department of [facility]  [address], [place] [phone] Accepts children up to age 26 who are enrolled in [place] or [place] Health Choice; pregnant women with a Medicaid card; and children who have applied for Medicaid or [place] Health Choice, but were declined, whose parents can pay a reduced fee at time of service.  [facility]  [address] [place], [place] [phone] Patients are seen by appointment only. Walk-ins are not accepted. [facility] will see patients 68 years of age and older. Monday - Tuesday (8am-5pm) Most Wednesdays (8:30-5pm) $30 per visit, cash only  [facility]  [address], [facility] [phone] Patients are seen by appointment only. Walk-ins are not  accepted. [facility] will see patients 72 years of age and older. One Wednesday Evening (Monthly: Volunteer Based).  $30 per visit, cash only  [employer]  [phone] for adults; Children under age 53, call Graduate Pediatric Dentistry at [phone]. Children aged 12-14, please call [phone] to request a pediatric application.  Dental services are provided in all areas of dental care including fillings, crowns and bridges, complete and partial dentures, implants, gum treatment, root canals, and extractions. Preventive care is also provided. Treatment is provided to both adults and children. Patients are selected via a lottery and there is often a waiting list.   [facility] [address] [place]  [phone] [email or web address]   [facility] [address] [place], [place] [phone], Ext. [phone] Second and Fourth Thursday of each month, opens at 6:30 AM; Clinic ends at 9 AM.  Patients are seen on a first-come first-served basis, and a limited number are seen during each clinic.   [facility]  [address] Ether Griffins [place], [place] [phone]   Eligibility Requirements You must have lived in [place], [place], or [place] counties for at least the last three months.   You cannot be eligible for state or federal sponsored [place], including [employer], [place], or [employer].   You generally cannot be eligible for healthcare insurance through your employer.    How to apply: Eligibility screenings are held every Tuesday and Wednesday afternoon from 1:00 pm until 4:00 pm. You do not need an appointment for the interview!  [facility] [address] [place], [place] [phone]   [facility] Department  [phone]  [facility]  [phone]   [facility] [place] [facility]  [phone]    Behavioral Health Resources in the  Community: Intensive Outpatient Programs Organization         Address  Phone  Notes  [facility] [address], [place], [place] [phone]   [facility] [address], [place], [place] [phone]   [facility] [address], [place], [place]  [phone]   [facility] [address],  [place], [place] [phone] or [phone]   Substance Abuse Resources Organization         Address  Phone  Notes  [facility]  [phone]   [facility]  [phone]   [facility]  [phone]   [facility]  [phone]   Residential & Outpatient Substance Abuse Program  [phone]   Psychological Services Organization         Address  Phone  Notes  [facility]  [phone]- [phone]   [facility]  [phone]- [phone]   [facility] [address], [place] [phone] or [phone]    Mobile Crisis Teams Organization         Address  Phone  Notes  [facility], Mobile Crisis Care Unit  [phone]   [facility]  [address] [place], [place] [phone]   Doristine LocksSharon DeEsch [address] [place] [phone]    Self-Help/Support Groups Organization         Address  Phone             Notes  [facility] [place] - variety of support groups  336- [phone] Call for more information  Narcotics Anonymous (NA), [facility] [address], [place]  2 meetings at this location   Statisticianesidential Treatment Programs Organization         Address  Phone  Notes  [facility] [address],    [place]  [phone]   [facility]  [address], [place] [phone], [place], [place] [phone]   [facility] [address], [place] [phone] Admissions: 8am-3pm M-F  [facility] [address]    [place], [place] [phone]   [facility] [address], [place], [place] [phone]   [facility] [address]  [place], [place] [phone]   [facility] - Intensive Outpatient [address], [place], [place] [phone]   [facility] ([facility] [address]  [place], [place] [phone] or [phone]   [facility] ([facility]) [address] [place], [place] [phone] Accepts Medicaid  [facility] [address]  [place] [phone] Substance Abuse/Addiction Treatment   [facility] Resources Organization         Address  Phone  Notes  [facility]  [phone]) [phone]   Angie FavaJulie Brannon, PhD [address] [place], [place]   [phone] or [phone]   [facility]   [address] [place], [place] [phone]   [facility] [address], [place], [place] [phone] Insurance/Medicaid/sponsorship through [employer] [facility]  [facility] Families [address]  [place], [place] [phone] [place], [place] [phone]    Dr. Adele Schilder  [phone]   [facility] Dept. 1) [address] [place] 2) [place] 3)  [address], [place] [phone]  [phone]   [place] [phone] or [phone] (After Hours)

## 2015-08-08 ENCOUNTER — Emergency Department (HOSPITAL_COMMUNITY)
Admission: EM | Admit: 2015-08-08 | Discharge: 2015-08-08 | Discharge status: Against Medical Advice | Payer: Medicaid Other | Attending: Emergency Medicine | Admitting: Emergency Medicine

## 2015-08-08 ENCOUNTER — Encounter (HOSPITAL_COMMUNITY): Payer: Self-pay | Admitting: *Deleted

## 2015-08-08 DIAGNOSIS — M545 Low back pain: Secondary | ICD-10-CM | POA: Diagnosis not present

## 2015-08-08 DIAGNOSIS — R51 Headache: Secondary | ICD-10-CM | POA: Insufficient documentation

## 2015-08-08 DIAGNOSIS — J3489 Other specified disorders of nose and nasal sinuses: Secondary | ICD-10-CM | POA: Diagnosis not present

## 2015-08-08 DIAGNOSIS — J45909 Unspecified asthma, uncomplicated: Secondary | ICD-10-CM | POA: Diagnosis not present

## 2015-08-08 NOTE — ED Notes (Signed)
Pt sts she is leaving and ambulated out of department

## 2015-08-08 NOTE — ED Notes (Signed)
Pt states that she has had a headahce and "nose burning" for 1 week. Pt states that she has lower back pain with no urinary symptoms as well.

## 2015-08-12 ENCOUNTER — Emergency Department (HOSPITAL_COMMUNITY)
Admission: EM | Admit: 2015-08-12 | Discharge: 2015-08-12 | Disposition: A | Discharge status: Home / Self Care | Payer: Medicaid Other | Attending: Emergency Medicine | Admitting: Emergency Medicine

## 2015-08-12 ENCOUNTER — Encounter (HOSPITAL_COMMUNITY): Payer: Self-pay | Admitting: Family Medicine

## 2015-08-12 DIAGNOSIS — M25569 Pain in unspecified knee: Secondary | ICD-10-CM | POA: Diagnosis not present

## 2015-08-12 DIAGNOSIS — I10 Essential (primary) hypertension: Secondary | ICD-10-CM | POA: Diagnosis not present

## 2015-08-12 DIAGNOSIS — M549 Dorsalgia, unspecified: Secondary | ICD-10-CM | POA: Diagnosis not present

## 2015-08-12 DIAGNOSIS — R51 Headache: Secondary | ICD-10-CM | POA: Diagnosis present

## 2015-08-12 DIAGNOSIS — J45909 Unspecified asthma, uncomplicated: Secondary | ICD-10-CM | POA: Insufficient documentation

## 2015-08-12 NOTE — ED Notes (Signed)
Pt called again for vital assessment, no answer.

## 2015-08-12 NOTE — ED Notes (Signed)
Pt here for headache x 1 week. sts that her BP has been up and she was given a BP med this am and lowered pressure. sts also back pain and knee pain.

## 2015-08-12 NOTE — ED Notes (Signed)
Pt called for vital sign reassessment with no answer.

## 2015-10-11 ENCOUNTER — Encounter (HOSPITAL_COMMUNITY): Payer: Self-pay | Admitting: Emergency Medicine

## 2015-10-11 ENCOUNTER — Emergency Department (HOSPITAL_COMMUNITY)
Admission: EM | Admit: 2015-10-11 | Discharge: 2015-10-11 | Disposition: A | Discharge status: Home / Self Care | Payer: Medicaid Other | Attending: Emergency Medicine | Admitting: Emergency Medicine

## 2015-10-11 DIAGNOSIS — K029 Dental caries, unspecified: Secondary | ICD-10-CM | POA: Insufficient documentation

## 2015-10-11 DIAGNOSIS — Z791 Long term (current) use of non-steroidal anti-inflammatories (NSAID): Secondary | ICD-10-CM | POA: Diagnosis not present

## 2015-10-11 DIAGNOSIS — Z79899 Other long term (current) drug therapy: Secondary | ICD-10-CM | POA: Diagnosis not present

## 2015-10-11 DIAGNOSIS — Z862 Personal history of diseases of the blood and blood-forming organs and certain disorders involving the immune mechanism: Secondary | ICD-10-CM | POA: Insufficient documentation

## 2015-10-11 DIAGNOSIS — Z792 Long term (current) use of antibiotics: Secondary | ICD-10-CM | POA: Diagnosis not present

## 2015-10-11 DIAGNOSIS — Z8619 Personal history of other infectious and parasitic diseases: Secondary | ICD-10-CM | POA: Insufficient documentation

## 2015-10-11 DIAGNOSIS — Z87891 Personal history of nicotine dependence: Secondary | ICD-10-CM | POA: Insufficient documentation

## 2015-10-11 DIAGNOSIS — K0889 Other specified disorders of teeth and supporting structures: Secondary | ICD-10-CM | POA: Diagnosis not present

## 2015-10-11 DIAGNOSIS — J45909 Unspecified asthma, uncomplicated: Secondary | ICD-10-CM | POA: Insufficient documentation

## 2015-10-11 HISTORY — DX: Sickle-cell trait: D57.3

## 2015-10-11 MED ORDER — PENICILLIN V POTASSIUM 250 MG PO TABS: 500.0000 mg | ORAL_TABLET | Freq: Once | ORAL | Status: DC

## 2015-10-11 MED ORDER — HYDROCODONE-ACETAMINOPHEN 5-325 MG PO TABS
1.0000 | ORAL_TABLET | Freq: Once | ORAL | Status: AC
  Administered 2015-10-11: 1 via ORAL
  Filled 2015-10-11: qty 1

## 2015-10-11 MED ORDER — CLINDAMYCIN HCL 150 MG PO CAPS
450.0000 mg | ORAL_CAPSULE | Freq: Once | ORAL | Status: AC
Start: 2015-10-11 — End: 2015-10-11
  Administered 2015-10-11: 450 mg via ORAL
  Filled 2015-10-11: qty 3

## 2015-10-11 MED ORDER — CLINDAMYCIN HCL 150 MG PO CAPS: 300.0000 mg | ORAL_CAPSULE | Freq: Three times a day (TID) | ORAL | Status: DC

## 2015-10-11 NOTE — Discharge Instructions (Signed)
You have a dental injury. It is very important that you get evaluated by a dentist as soon as possible. Call tomorrow to schedule an appointment. Take your full course of antibiotics. Read the instructions below. ° °Eat a soft or liquid diet and rinse your mouth out after meals with warm water. You should see a dentist or return here at once if you have increased swelling, increased pain or uncontrolled bleeding from the site of your injury. ° °SEEK MEDICAL CARE IF:  °· You have increased pain not controlled with medicines.  °· You have swelling around your tooth, in your face or neck.  °· You have bleeding which starts, continues, or gets worse.  °· You have a fever >101 °· If you are unable to open your mouth ° °Community Resource Guide Dental °The United Way’s “211” is a great source of information about community services available.  Access by dialing 2-1-1 from anywhere in [place], or by website -  www.nc211.org.  ° °Other Local Resources (Updated 06/2015) ° °Dental  Care °  °Services ° °  °Phone Number and Address  °Cost  °[facility] For children 0 - 21 years of age:  °• Cleaning °• Tooth brushing/flossing instruction °• Sealants, fillings, crowns °• Extractions °• Emergency treatment  [phone] °[address] °[place], [place] [address] Charges based on family income.  Medicaid and some insurance plans accepted.   °  °[facility] [place] • Cleaning °• Sealants, fillings, crowns °• Extractions °• Emergency treatment [phone] °[address] °[place], [place] ° Pregnant women 18 years of age or older with a Medicaid card  °[facility] [place] • Cleaning °• Sealants, fillings, crowns °• Extractions °• Emergency treatment [phone] °[address] °[place], [place] Pregnant women 18 years of age or older with a Medicaid card  °[facility] For children 0 - 21 years of age:  °• Cleaning °• Tooth brushing/flossing instruction °• Sealants, fillings, crowns °• Extractions °• Emergency treatment °Limited orthodontic services for patients with Medicaid [phone] °[address] °[place], [place] [address] Medicaid and [place] Health Choice cover for children up to age 21 and pregnant women.  Parents of children up to age 21 without Medicaid pay a reduced fee at time of service.  °[facility] [place] For children 0 - 21 years of age:  °• Cleaning °• Tooth brushing/flossing instruction °• Sealants, fillings, crowns °• Extractions °• Emergency treatment °Limited orthodontic services for patients with Medicaid [phone] °[address] °[place], [place].  Medicaid and [place] Health Choice cover for children up to age 21 and pregnant women.  Parents of children up to age 21 without Medicaid pay a reduced fee.  °[facility] • Cleaning °• Sealants, fillings, crowns °• Extractions ° °Hours: Tuesdays and Thursdays, 4:15 - 8 pm [phone] °[address] °[place],  [address] Services free of charge to [place] residents ages 18-64 who do not have health insurance, Medicare, Medicaid, or VA benefits and fall within federal poverty guidelines  °[facility] ° ° ° Provides dental care in addition to primary medical care, nutritional counseling, and pharmacy: °• Cleaning °• Sealants, fillings, crowns °• Extractions ° ° ° ° ° ° ° ° ° ° ° ° ° ° ° ° ° [phone] °[facility],   [address] °[place], [place] ° °[phone] °[facility], [address] [place], [place] ° °[phone] °[facility] °[place], [place] ° °[phone] °[facility], [address] °[place], [place] ° °[phone] °[facility] °[address] °[place], [place]  Accepts Medicaid, Medicare, most insurance.  Also provides services available to all with fees adjusted based on ability to pay.    °[facility] • Cleaning °• Tooth brushing/flossing instruction °• Sealants, fillings, crowns °• Extractions °• Emergency treatment °Hours: Tuesdays, Thursdays, and Fridays from 8 am to 5 pm by appointment only. [phone] °[address] °[place], [place] [address] [place] residents with Medicaid (depending on eligibility) and children with [place] Health Choice - call for more information.  °[facility] • Extractions only ° °Hours: 2nd and 4th Thursday of each month from 6:30 am - 9 am.   [phone] ext. [phone] °[address] °[place], [place] [address] Ages 18 and older only.  Patients are seen on a first come, first served basis.  °[facility] • Cleanings °• Fillings °• Extractions °• Orthodontics °• Endodontics °• Implants/Crowns/Bridges °• Complete and partial dentures [phone] °[place], [place] Patients must complete an application for services.  There is often a waiting list.   ° °

## 2015-10-11 NOTE — ED Provider Notes (Signed)
CSN: [id number]     Arrival date & time 10/11/15  1107 History  By signing my name below, I, [name], attest that this documentation has been prepared under the direction and in the presence of Elizabeth SauerJaime Khaleah Duer, PA-C Electronically Signed: Charline BillsEssence Howell, ED Scribe 10/11/2015 at 12:25 PM.   Chief Complaint  Patient presents with  . Dental Pain   The history is provided by the patient. No language interpreter was used.   HPI Comments: Dana Mcconnell is a 25 y.o. female who presents to the Emergency Department complaining of gradually worsening left lower dental pain onset 3 days ago. Pt states that dental pain radiates into her left face and is exacerbated with palpation. She has tried ibuprofen without significant relief. She denies fever, trouble breathing, trouble swallowing. Pt does not currently have a dentist.   Past Medical History  Diagnosis Date  . Asthma     inhaler prn  . Chlamydia   . Sickle cell trait [facility](HCC)    Past Surgical History  Procedure Laterality Date  . Tonsillectomy    . Umbilical hernia repair      > 10 yrs ago   . Knee dislocation surgery  2009    left  . Hernia repair    . Cesarean section N/A 05/02/2013    Procedure: Primary Ceasrean Section Delivery Baby Boy @ 0135, Apgars 7/9;  Surgeon: Adam PhenixJames G Arnold, MD;  Location: [facility];  Service: Obstetrics;  Laterality: N/A;   Family History  Problem Relation Age of Onset  . Anesthesia problems Neg Hx   . Hypotension Neg Hx   . Malignant hyperthermia Neg Hx   . Pseudochol deficiency Neg Hx   . Alcohol abuse Neg Hx   . Arthritis Neg Hx   . Birth defects Neg Hx   . COPD Neg Hx   . Cancer Neg Hx   . Depression Neg Hx   . Drug abuse Neg Hx   . Early death Neg Hx   . Hearing loss Neg Hx   . Heart disease Neg Hx   . Hyperlipidemia Neg Hx   . Hypertension Neg Hx   . Kidney disease Neg Hx   . Learning disabilities Neg Hx   . Mental illness Neg Hx   . Mental retardation Neg Hx   . Miscarriages /  Stillbirths Neg Hx   . Stroke Neg Hx   . Vision loss Neg Hx   . Asthma Maternal Grandmother   . Diabetes Maternal Grandfather    Social History  Substance Use Topics  . Smoking status: Former Smoker    Quit date: 08/21/2010  . Smokeless tobacco: Never Used  . Alcohol Use: No   OB History    Gravida Para Term Preterm AB TAB SAB Ectopic Multiple Living   2 2 2  0 0 0 0 0 0 2     Review of Systems  Constitutional: Negative for fever.  HENT: Positive for dental problem. Negative for trouble swallowing.   Respiratory: Negative for shortness of breath.    Allergies  Review of patient's allergies indicates no known allergies.  Home Medications   Prior to Admission medications   Medication Sig Start Date End Date Taking? Authorizing Provider  albuterol (PROVENTIL HFA;VENTOLIN HFA) 108 (90 BASE) MCG/ACT inhaler Inhale 2 puffs into the lungs every 6 (six) hours as needed for wheezing or shortness of breath.    Historical Provider, MD  benzonatate (TESSALON) 100 MG capsule Take 1 capsule (100 mg total) by  mouth every 8 (eight) hours. 06/17/14   Renne Crigler, PA-C  clindamycin (CLEOCIN) 150 MG capsule Take 2 capsules (300 mg total) by mouth 3 (three) times daily. 10/11/15   Chase Picket Galit Urich, PA-C  naproxen (NAPROSYN) 500 MG tablet Take 1 tablet (500 mg total) by mouth 2 (two) times daily with a meal. 06/30/14   Eber Hong, MD  penicillin v potassium (VEETID) 500 MG tablet Take 1 tablet (500 mg total) by mouth 4 (four) times daily. 06/30/14   Eber Hong, MD  Prenatal Vit-Fe Fumarate-FA (PRENATAL MULTIVITAMIN) TABS tablet Take 1 tablet by mouth daily at 12 noon.    Historical Provider, MD   BP 133/95 mmHg  Pulse 66  Temp(Src) 98.9 F (37.2 C) (Oral)  Resp 20  SpO2 100%  LMP 10/06/2015 Physical Exam  Constitutional: She is oriented to person, place, and time. She appears well-developed and well-nourished. No distress.  HENT:  Head: Normocephalic and atraumatic.  Mouth/Throat:  Oropharynx is clear and moist. No oropharyngeal exudate.    Dental cavities and poor oral dentition noted, pain along tooth as depicted in image, midline uvula, no trismus, oropharynx moist and clear, no abscess noted, no oropharyngeal erythema or edema, neck supple and no tenderness. No facial edema  Eyes: Conjunctivae and EOM are normal.  Neck: Neck supple. No tracheal deviation present.  Cardiovascular: Normal rate, regular rhythm and normal heart sounds.  Exam reveals no gallop and no friction rub.   No murmur heard. Pulmonary/Chest: Effort normal and breath sounds normal. No respiratory distress. She has no wheezes. She has no rales.  Musculoskeletal: Normal range of motion.  Neurological: She is alert and oriented to person, place, and time.  Skin: Skin is warm and dry.  Psychiatric: She has a normal mood and affect. Her behavior is normal.  Nursing note and vitals reviewed.  ED Course  Procedures (including critical care time) DIAGNOSTIC STUDIES: Oxygen Saturation is 100% on RA, normal by my interpretation.    COORDINATION OF CARE: 12:20 PM-Discussed treatment plan which includes Norco, Cleocin and f/u with dentist with pt at bedside and pt agreed to plan.   Labs Review Labs Reviewed - No data to display  Imaging Review No results found.   EKG Interpretation None      MDM   Final diagnoses:  Pain, dental   Patient with dentalgia. No abscess requiring immediate incision and drainage. Patient is afebrile, non toxic appearing, and swallowing secretions well. Exam not concerning for Ludwig's angina or pharyngeal abscess. One dose of pain medication given in ED. Will treat with Clinda. I gave patient resources for dentist and stressed the importance of dental follow up for ultimate management of dental pain. Patient voices understanding and is agreeable to plan.  I personally performed the services described in this documentation, which was scribed in my presence. The  recorded information has been reviewed and is accurate.  [facility] Trayce Maino, PA-C 10/11/15 1229  Arby Barrette, MD 10/14/15 [phone]

## 2015-10-11 NOTE — ED Notes (Signed)
Pt c/o L lower dental pain since Friday. States she has been taking ibuprofen without relief. Pt tearful at triage.

## 2015-10-11 NOTE — ED Notes (Signed)
Declined W/C at D/C and was escorted to lobby by RN. 

## 2016-03-29 ENCOUNTER — Ambulatory Visit (INDEPENDENT_AMBULATORY_CARE_PROVIDER_SITE_OTHER): Payer: Medicaid Other

## 2016-03-29 ENCOUNTER — Encounter (HOSPITAL_COMMUNITY): Payer: Self-pay | Admitting: Emergency Medicine

## 2016-03-29 ENCOUNTER — Ambulatory Visit (HOSPITAL_COMMUNITY)
Admission: EM | Admit: 2016-03-29 | Discharge: 2016-03-29 | Disposition: A | Discharge status: Home / Self Care | Payer: Medicaid Other | Attending: Family Medicine | Admitting: Family Medicine

## 2016-03-29 DIAGNOSIS — S91001A Unspecified open wound, right ankle, initial encounter: Secondary | ICD-10-CM

## 2016-03-29 DIAGNOSIS — Z23 Encounter for immunization: Secondary | ICD-10-CM | POA: Diagnosis not present

## 2016-03-29 DIAGNOSIS — S90551A Superficial foreign body, right ankle, initial encounter: Secondary | ICD-10-CM | POA: Diagnosis not present

## 2016-03-29 DIAGNOSIS — W3400XA Accidental discharge from unspecified firearms or gun, initial encounter: Secondary | ICD-10-CM

## 2016-03-29 DIAGNOSIS — S91031A Puncture wound without foreign body, right ankle, initial encounter: Principal | ICD-10-CM

## 2016-03-29 MED ORDER — NAPROXEN 500 MG PO TABS: 500.0000 mg | ORAL_TABLET | Freq: Two times a day (BID) | ORAL | 0 refills | Status: DC

## 2016-03-29 MED ORDER — TETANUS-DIPHTH-ACELL PERTUSSIS 5-2.5-18.5 LF-MCG/0.5 IM SUSP
INTRAMUSCULAR | Status: AC
  Filled 2016-03-29: qty 0.5

## 2016-03-29 MED ORDER — TETANUS-DIPHTH-ACELL PERTUSSIS 5-2.5-18.5 LF-MCG/0.5 IM SUSP
0.5000 mL | Freq: Once | INTRAMUSCULAR | Status: AC
  Administered 2016-03-29: 0.5 mL via INTRAMUSCULAR

## 2016-03-29 MED ORDER — TETANUS-DIPHTHERIA TOXOIDS TD 5-2 LFU IM INJ: 0.5000 mL | INJECTION | Freq: Once | INTRAMUSCULAR | Status: DC
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
## 2016-03-29 NOTE — ED Notes (Signed)
gpd at bedside 
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
## 2016-12-18 ENCOUNTER — Encounter (HOSPITAL_COMMUNITY): Payer: Self-pay | Admitting: Emergency Medicine

## 2016-12-18 ENCOUNTER — Emergency Department (HOSPITAL_COMMUNITY)
Admission: EM | Admit: 2016-12-18 | Discharge: 2016-12-18 | Disposition: A | Discharge status: Home / Self Care | Payer: Medicaid Other | Attending: Emergency Medicine | Admitting: Emergency Medicine

## 2016-12-18 DIAGNOSIS — R51 Headache: Secondary | ICD-10-CM | POA: Insufficient documentation

## 2016-12-18 DIAGNOSIS — Z5321 Procedure and treatment not carried out due to patient leaving prior to being seen by health care provider: Secondary | ICD-10-CM | POA: Insufficient documentation

## 2016-12-18 NOTE — ED Triage Notes (Signed)
Patient c/o intermittent headache over past few days. patient reports taking Goody's powder couple days ago and relieved headache but denies taking more today. Pain is more on left side of head today that radiates dow neck.

## 2017-02-07 ENCOUNTER — Ambulatory Visit: Payer: Medicaid Other

## 2017-02-13 ENCOUNTER — Encounter: Payer: Self-pay | Admitting: Obstetrics and Gynecology

## 2017-02-13 ENCOUNTER — Ambulatory Visit (INDEPENDENT_AMBULATORY_CARE_PROVIDER_SITE_OTHER): Payer: Medicaid Other

## 2017-02-13 DIAGNOSIS — Z3201 Encounter for pregnancy test, result positive: Secondary | ICD-10-CM

## 2017-02-13 LAB — POCT PREGNANCY, URINE: PREG TEST UR: POSITIVE — AB

## 2017-02-13 NOTE — Progress Notes (Signed)
Patient presented to the office today for pregnancy test. Test does confirm pregnancy at this time. Patient is unsure of LMP at this time. She reports having a period in June but unsure of date. Medications reviewed at this time. A pregnancy verification letter was provided to patient to apply for services. Patient voice understanding at this time.

## 2017-02-14 LAB — POCT PREGNANCY, URINE: PREG TEST UR: POSITIVE — AB

## 2017-03-14 ENCOUNTER — Ambulatory Visit (INDEPENDENT_AMBULATORY_CARE_PROVIDER_SITE_OTHER): Payer: Medicaid Other | Admitting: Medical

## 2017-03-14 ENCOUNTER — Other Ambulatory Visit (HOSPITAL_COMMUNITY)
Admission: RE | Admit: 2017-03-14 | Discharge: 2017-03-14 | Disposition: A | Discharge status: Home / Self Care | Payer: Medicaid Other | Source: Ambulatory Visit | Attending: Medical | Admitting: Medical

## 2017-03-14 ENCOUNTER — Ambulatory Visit: Payer: Self-pay

## 2017-03-14 ENCOUNTER — Encounter: Payer: Self-pay | Admitting: Medical

## 2017-03-14 VITALS — BP 125/67 | HR 72 | Wt 144.4 lb

## 2017-03-14 DIAGNOSIS — O99011 Anemia complicating pregnancy, first trimester: Secondary | ICD-10-CM

## 2017-03-14 DIAGNOSIS — Z124 Encounter for screening for malignant neoplasm of cervix: Secondary | ICD-10-CM | POA: Diagnosis not present

## 2017-03-14 DIAGNOSIS — R87610 Atypical squamous cells of undetermined significance on cytologic smear of cervix (ASC-US): Secondary | ICD-10-CM | POA: Insufficient documentation

## 2017-03-14 DIAGNOSIS — R87810 Cervical high risk human papillomavirus (HPV) DNA test positive: Secondary | ICD-10-CM | POA: Diagnosis not present

## 2017-03-14 DIAGNOSIS — Z3480 Encounter for supervision of other normal pregnancy, unspecified trimester: Secondary | ICD-10-CM | POA: Diagnosis present

## 2017-03-14 DIAGNOSIS — Z113 Encounter for screening for infections with a predominantly sexual mode of transmission: Secondary | ICD-10-CM

## 2017-03-14 DIAGNOSIS — O3680X0 Pregnancy with inconclusive fetal viability, not applicable or unspecified: Secondary | ICD-10-CM | POA: Diagnosis present

## 2017-03-14 DIAGNOSIS — D573 Sickle-cell trait: Secondary | ICD-10-CM

## 2017-03-14 DIAGNOSIS — Z3481 Encounter for supervision of other normal pregnancy, first trimester: Secondary | ICD-10-CM | POA: Diagnosis not present

## 2017-03-14 DIAGNOSIS — Z98891 History of uterine scar from previous surgery: Secondary | ICD-10-CM | POA: Diagnosis not present

## 2017-03-14 LAB — POCT URINALYSIS DIP (DEVICE)
Bilirubin Urine: NEGATIVE
Glucose, UA: NEGATIVE mg/dL
HGB URINE DIPSTICK: NEGATIVE
Ketones, ur: NEGATIVE mg/dL
NITRITE: NEGATIVE
PROTEIN: NEGATIVE mg/dL
SPECIFIC GRAVITY, URINE: 1.02 (ref 1.005–1.030)
UROBILINOGEN UA: 0.2 mg/dL (ref 0.0–1.0)
pH: 6 (ref 5.0–8.0)

## 2017-03-14 MED ORDER — PRENATAL VITAMINS 0.8 MG PO TABS: 1.0000 | ORAL_TABLET | Freq: Every day | ORAL | 12 refills | Status: DC

## 2017-03-14 NOTE — Progress Notes (Signed)
   PRENATAL VISIT NOTE  Subjective:  Dana Mcconnell is a 26 y.o. G3P2002 at [redacted]w[redacted]d being seen today for initial prenatal visit.  She is currently monitored for the following issues for this low-risk pregnancy and has Supervision of other normal pregnancy, antepartum; Sickle cell trait (HCC); and Previous cesarean section on her problem list.  Patient reports no complaints.  Contractions: Not present. Vag. Bleeding: None.  Movement: Absent. Denies leaking of fluid.   The following portions of the patient's history were reviewed and updated as appropriate: allergies, current medications, past family history, past medical history, past social history, past surgical history and problem list. Problem list updated.  Objective:   Vitals:   03/14/17 1242  BP: 125/67  Pulse: 72  Weight: 144 lb 6.4 oz (65.5 kg)    Fetal Status:     Movement: Absent     General:  Alert, oriented and cooperative. Patient is in no acute distress.  Skin: Skin is warm and dry. No rash noted.   Cardiovascular: Normal heart rate noted  Respiratory: Normal respiratory effort, no problems with respiration noted  Abdomen: Soft, gravid, appropriate for gestational age.  Pain/Pressure: Absent     Pelvic: Cervical exam performed        Extremities: Normal range of motion.  Edema: None  Mental Status:  Normal mood and affect. Normal behavior. Normal judgment and thought content.   Assessment and Plan:  Pregnancy: G3P2002 at [redacted]w[redacted]d  1. Supervision of other normal pregnancy, antepartum - Obstetric Panel, Including HIV - Culture, OB Urine - Enroll patient in Babyscripts Program - Cytology - PAP - US OB Limited; Future - Prenatal Multivit-Min-Fe-FA (PRENATAL VITAMINS) 0.8 MG tablet; Take 1 tablet by mouth daily.  Dispense: 30 tablet; Refill: 12  2. Encounter to determine fetal viability of pregnancy, single or unspecified fetus - US OB Limited; performed today - see noted from Dana Day, RN  3. Sickle cell trait  (HCC) - FOB - negative  4. Previous cesarean section - Plans TOLAC, will sign consent at 28 weeks  First trimester warning symptoms and general obstetric precautions including but not limited to vaginal bleeding, contractions, leaking of fluid and fetal movement were reviewed in detail with the patient. Please refer to After Visit Summary for other counseling recommendations.  Return in about 4 weeks (around 04/11/2017) for LOB.   Vonzella Nipple, PA-C

## 2017-03-14 NOTE — Progress Notes (Signed)
Pt informed that the ultrasound is considered a limited OB ultrasound and is not intended to be a complete ultrasound exam.  Patient also informed that the ultrasound is not being completed with the intent of assessing for fetal or placental anomalies or any pelvic abnormalities.  Explained that the purpose of today's ultrasound is to assess for  viability.  Patient acknowledges the purpose of the exam and the limitations of the study.    

## 2017-03-14 NOTE — Patient Instructions (Signed)

## 2017-03-16 LAB — URINE CULTURE, OB REFLEX

## 2017-03-16 LAB — CULTURE, OB URINE

## 2017-03-20 ENCOUNTER — Encounter: Payer: Self-pay | Admitting: Obstetrics and Gynecology

## 2017-03-20 DIAGNOSIS — J45909 Unspecified asthma, uncomplicated: Secondary | ICD-10-CM | POA: Insufficient documentation

## 2017-03-20 DIAGNOSIS — R87810 Cervical high risk human papillomavirus (HPV) DNA test positive: Secondary | ICD-10-CM

## 2017-03-20 DIAGNOSIS — R87610 Atypical squamous cells of undetermined significance on cytologic smear of cervix (ASC-US): Secondary | ICD-10-CM | POA: Insufficient documentation

## 2017-03-20 LAB — CYTOLOGY - PAP
Chlamydia: NEGATIVE
Diagnosis: UNDETERMINED — AB
HPV: DETECTED — AB
Neisseria Gonorrhea: NEGATIVE

## 2017-03-21 LAB — OBSTETRIC PANEL, INCLUDING HIV
BASOS: 0 %
Basophils Absolute: 0 10*3/uL (ref 0.0–0.2)
EOS (ABSOLUTE): 0.1 10*3/uL (ref 0.0–0.4)
EOS: 1 %
HEMATOCRIT: 33 % — AB (ref 34.0–46.6)
HEMOGLOBIN: 10.9 g/dL — AB (ref 11.1–15.9)
HEP B S AG: NEGATIVE
HIV Screen 4th Generation wRfx: NONREACTIVE
IMMATURE GRANULOCYTES: 0 %
Immature Grans (Abs): 0 10*3/uL (ref 0.0–0.1)
LYMPHS ABS: 1.2 10*3/uL (ref 0.7–3.1)
Lymphs: 24 %
MCH: 31.1 pg (ref 26.6–33.0)
MCHC: 33 g/dL (ref 31.5–35.7)
MCV: 94 fL (ref 79–97)
Monocytes Absolute: 0.3 10*3/uL (ref 0.1–0.9)
Monocytes: 6 %
NEUTROS PCT: 69 %
Neutrophils Absolute: 3.4 10*3/uL (ref 1.4–7.0)
Platelets: 195 10*3/uL (ref 150–379)
RBC: 3.51 x10E6/uL — ABNORMAL LOW (ref 3.77–5.28)
RDW: 14.2 % (ref 12.3–15.4)
RH TYPE: POSITIVE
RPR: NONREACTIVE
Rubella Antibodies, IGG: 1.68 index (ref >0.99)
WBC: 4.9 10*3/uL (ref 3.4–10.8)

## 2017-03-21 LAB — AB SCR+ANTIBODY ID: Antibody Screen: POSITIVE — AB

## 2017-03-26 ENCOUNTER — Telehealth: Payer: Self-pay | Admitting: *Deleted

## 2017-03-26 NOTE — Telephone Encounter (Signed)
Per message from provider I called [facility] and was unable to leave a message- heard message not able to receive calls at this time.  Have made note on her next ob appt she needs colposcopy and sent message to registrars to reschedule with other provider if Thressa Sheller, CNM is unable to do colposcopy.

## 2017-03-26 NOTE — Telephone Encounter (Signed)
-----   Message from Marny Lowenstein, PA-C sent at 03/20/2017  2:28 PM EDT ----- Patient needs colposcopy. Please see if we can schedule an upcoming OB visit with someone who can do a colpo.   Thanks,  Raynelle Fanning

## 2017-03-27 NOTE — Telephone Encounter (Signed)
Called patient & informed her of results & recommendations. Told patient we may do a colposcopy at her next appt and possibly repeat postpartum. Patient verbalized understanding and had no questions

## 2017-04-11 ENCOUNTER — Ambulatory Visit (INDEPENDENT_AMBULATORY_CARE_PROVIDER_SITE_OTHER): Payer: Medicaid Other | Admitting: Advanced Practice Midwife

## 2017-04-11 VITALS — BP 103/55 | HR 79 | Wt 146.8 lb

## 2017-04-11 DIAGNOSIS — Z3482 Encounter for supervision of other normal pregnancy, second trimester: Secondary | ICD-10-CM | POA: Diagnosis present

## 2017-04-11 DIAGNOSIS — Z3480 Encounter for supervision of other normal pregnancy, unspecified trimester: Secondary | ICD-10-CM

## 2017-04-11 DIAGNOSIS — Z23 Encounter for immunization: Secondary | ICD-10-CM

## 2017-04-11 NOTE — Progress Notes (Signed)
Routine

## 2017-04-11 NOTE — Patient Instructions (Signed)
Safe Medications in Pregnancy   Acne: Benzoyl Peroxide Salicylic Acid  Backache/Headache: Tylenol: 2 regular strength every 4 hours OR              2 Extra strength every 6 hours  Colds/Coughs/Allergies: Benadryl (alcohol free) 25 mg every 6 hours as needed Breath right strips Claritin Cepacol throat lozenges Chloraseptic throat spray Cold-Eeze- up to three times per day Cough drops, alcohol free Flonase (by prescription only) Guaifenesin Mucinex Robitussin DM (plain only, alcohol free) Saline nasal spray/drops Sudafed (pseudoephedrine) & Actifed ** use only after [redacted] weeks gestation and if you do not have high blood pressure Tylenol Vicks Vaporub Zinc lozenges Zyrtec   Constipation: Colace Ducolax suppositories Fleet enema Glycerin suppositories Metamucil Milk of magnesia Miralax Senokot Smooth move tea  Diarrhea: Kaopectate Imodium A-D  *NO pepto Bismol  Hemorrhoids: Anusol Anusol HC Preparation H Tucks  Indigestion: Tums Maalox Mylanta Zantac  Pepcid  Insomnia: Benadryl (alcohol free) 25mg every 6 hours as needed Tylenol PM Unisom, no Gelcaps  Leg Cramps: Tums MagGel  Nausea/Vomiting:  Bonine Dramamine Emetrol Ginger extract Sea bands Meclizine  Nausea medication to take during pregnancy:  Unisom (doxylamine succinate 25 mg tablets) Take one tablet daily at bedtime. If symptoms are not adequately controlled, the dose can be increased to a maximum recommended dose of two tablets daily (1/2 tablet in the morning, 1/2 tablet mid-afternoon and one at bedtime). Vitamin B6 100mg tablets. Take one tablet twice a day (up to 200 mg per day).  Skin Rashes: Aveeno products Benadryl cream or 25mg every 6 hours as needed Calamine Lotion 1% cortisone cream  Yeast infection: Gyne-lotrimin 7 Monistat 7   **If taking multiple medications, please check labels to avoid duplicating the same active ingredients **take medication as directed on  the label ** Do not exceed 4000 mg of tylenol in 24 hours **Do not take medications that contain aspirin or ibuprofen     Second Trimester of Pregnancy The second trimester is from week 14 through week 27 (months 4 through 6). The second trimester is often a time when you feel your best. Your body has adjusted to being pregnant, and you begin to feel better physically. Usually, morning sickness has lessened or quit completely, you may have more energy, and you may have an increase in appetite. The second trimester is also a time when the fetus is growing rapidly. At the end of the sixth month, the fetus is about 9 inches long and weighs about 1 pounds. You will likely begin to feel the baby move (quickening) between 16 and 20 weeks of pregnancy. Body changes during your second trimester Your body continues to go through many changes during your second trimester. The changes vary from woman to woman.  Your weight will continue to increase. You will notice your lower abdomen bulging out.  You may begin to get stretch marks on your hips, abdomen, and breasts.  You may develop headaches that can be relieved by medicines. The medicines should be approved by your health care provider.  You may urinate more often because the fetus is pressing on your bladder.  You may develop or continue to have heartburn as a result of your pregnancy.  You may develop constipation because certain hormones are causing the muscles that push waste through your intestines to slow down.  You may develop hemorrhoids or swollen, bulging veins (varicose veins).  You may have back pain. This is caused by: ? Weight gain. ? Pregnancy hormones that are   relaxing the joints in your pelvis. ? A shift in weight and the muscles that support your balance.  Your breasts will continue to grow and they will continue to become tender.  Your gums may bleed and may be sensitive to brushing and flossing.  Dark spots or blotches  (chloasma, mask of pregnancy) may develop on your face. This will likely fade after the baby is born.  A dark line from your belly button to the pubic area (linea nigra) may appear. This will likely fade after the baby is born.  You may have changes in your hair. These can include thickening of your hair, rapid growth, and changes in texture. Some women also have hair loss during or after pregnancy, or hair that feels dry or thin. Your hair will most likely return to normal after your baby is born.  What to expect at prenatal visits During a routine prenatal visit:  You will be weighed to make sure you and the fetus are growing normally.  Your blood pressure will be taken.  Your abdomen will be measured to track your baby's growth.  The fetal heartbeat will be listened to.  Any test results from the previous visit will be discussed.  Your health care provider may ask you:  How you are feeling.  If you are feeling the baby move.  If you have had any abnormal symptoms, such as leaking fluid, bleeding, severe headaches, or abdominal cramping.  If you are using any tobacco products, including cigarettes, chewing tobacco, and electronic cigarettes.  If you have any questions.  Other tests that may be performed during your second trimester include:  Blood tests that check for: ? Low iron levels (anemia). ? High blood sugar that affects pregnant women (gestational diabetes) between 24 and 28 weeks. ? Rh antibodies. This is to check for a protein on red blood cells (Rh factor).  Urine tests to check for infections, diabetes, or protein in the urine.  An ultrasound to confirm the proper growth and development of the baby.  An amniocentesis to check for possible genetic problems.  Fetal screens for spina bifida and Down syndrome.  HIV (human immunodeficiency virus) testing. Routine prenatal testing includes screening for HIV, unless you choose not to have this test.  Follow  these instructions at home: Medicines  Follow your health care provider's instructions regarding medicine use. Specific medicines may be either safe or unsafe to take during pregnancy.  Take a prenatal vitamin that contains at least 600 micrograms (mcg) of folic acid.  If you develop constipation, try taking a stool softener if your health care provider approves. Eating and drinking  Eat a balanced diet that includes fresh fruits and vegetables, whole grains, good sources of protein such as meat, eggs, or tofu, and low-fat dairy. Your health care provider will help you determine the amount of weight gain that is right for you.  Avoid raw meat and uncooked cheese. These carry germs that can cause birth defects in the baby.  If you have low calcium intake from food, talk to your health care provider about whether you should take a daily calcium supplement.  Limit foods that are high in fat and processed sugars, such as fried and sweet foods.  To prevent constipation: ? Drink enough fluid to keep your urine clear or pale yellow. ? Eat foods that are high in fiber, such as fresh fruits and vegetables, whole grains, and beans. Activity  Exercise only as directed by your health care provider.   Most women can continue their usual exercise routine during pregnancy. Try to exercise for 30 minutes at least 5 days a week. Stop exercising if you experience uterine contractions.  Avoid heavy lifting, wear low heel shoes, and practice good posture.  A sexual relationship may be continued unless your health care provider directs you otherwise. Relieving pain and discomfort  Wear a good support bra to prevent discomfort from breast tenderness.  Take warm sitz baths to soothe any pain or discomfort caused by hemorrhoids. Use hemorrhoid cream if your health care provider approves.  Rest with your legs elevated if you have leg cramps or low back pain.  If you develop varicose veins, wear support  hose. Elevate your feet for 15 minutes, 3-4 times a day. Limit salt in your diet. Prenatal Care  Write down your questions. Take them to your prenatal visits.  Keep all your prenatal visits as told by your health care provider. This is important. Safety  Wear your seat belt at all times when driving.  Make a list of emergency phone numbers, including numbers for family, friends, the hospital, and police and fire departments. General instructions  Ask your health care provider for a referral to a local prenatal education class. Begin classes no later than the beginning of month 6 of your pregnancy.  Ask for help if you have counseling or nutritional needs during pregnancy. Your health care provider can offer advice or refer you to specialists for help with various needs.  Do not use hot tubs, steam rooms, or saunas.  Do not douche or use tampons or scented sanitary pads.  Do not cross your legs for long periods of time.  Avoid cat litter boxes and soil used by cats. These carry germs that can cause birth defects in the baby and possibly loss of the fetus by miscarriage or stillbirth.  Avoid all smoking, herbs, alcohol, and unprescribed drugs. Chemicals in these products can affect the formation and growth of the baby.  Do not use any products that contain nicotine or tobacco, such as cigarettes and e-cigarettes. If you need help quitting, ask your health care provider.  Visit your dentist if you have not gone yet during your pregnancy. Use a soft toothbrush to brush your teeth and be gentle when you floss. Contact a health care provider if:  You have dizziness.  You have mild pelvic cramps, pelvic pressure, or nagging pain in the abdominal area.  You have persistent nausea, vomiting, or diarrhea.  You have a bad smelling vaginal discharge.  You have pain when you urinate. Get help right away if:  You have a fever.  You are leaking fluid from your vagina.  You have  spotting or bleeding from your vagina.  You have severe abdominal cramping or pain.  You have rapid weight gain or weight loss.  You have shortness of breath with chest pain.  You notice sudden or extreme swelling of your face, hands, ankles, feet, or legs.  You have not felt your baby move in over an hour.  You have severe headaches that do not go away when you take medicine.  You have vision changes. Summary  The second trimester is from week 14 through week 27 (months 4 through 6). It is also a time when the fetus is growing rapidly.  Your body goes through many changes during pregnancy. The changes vary from woman to woman.  Avoid all smoking, herbs, alcohol, and unprescribed drugs. These chemicals affect the formation and growth   your baby.  Do not use any tobacco products, such as cigarettes, chewing tobacco, and e-cigarettes. If you need help quitting, ask your health care provider.  Contact your health care provider if you have any questions. Keep all prenatal visits as told by your health care provider. This is important. This information is not intended to replace advice given to you by your health care provider. Make sure you discuss any questions you have with your health care provider. Document Released: 05/30/2001 Document Revised: 11/11/2015 Document Reviewed: 08/06/2012 Elsevier Interactive Patient Education  2017 [employer]  

## 2017-04-11 NOTE — Progress Notes (Signed)
   PRENATAL VISIT NOTE  Subjective:  Dana Mcconnell is a 26 y.o. G3P2002 at 6821w0d being seen today for ongoing prenatal care.  She is currently monitored for the following issues for this low-risk pregnancy and has Supervision of other normal pregnancy, antepartum; Sickle cell trait (HCC); Previous cesarean section; Asthma; and ASCUS with positive high risk HPV cervical on her problem list.  Patient reports backache. Patient reports working long hours while standing and occasional backaches at the end of the day.  Contractions: Not present. Vag. Bleeding: None.  Movement: Absent. Denies leaking of fluid.   The following portions of the patient's history were reviewed and updated as appropriate: allergies, current medications, past family history, past medical history, past social history, past surgical history and problem list. Problem list updated.  Objective:   Vitals:   04/11/17 1334  BP: (!) 103/55  Pulse: 79  Weight: 146 lb 12.8 oz (66.6 kg)    Fetal Status: Fetal Heart Rate (bpm): 155 Fundal Height: 17 cm Movement: Absent     General:  Alert, oriented and cooperative. Patient is in no acute distress.  Skin: Skin is warm and dry. No rash noted.   Cardiovascular: Normal heart rate noted  Respiratory: Normal respiratory effort, no problems with respiration noted  Abdomen: Soft, gravid, appropriate for gestational age.  Pain/Pressure: Present     Pelvic: Cervical exam deferred        Extremities: Normal range of motion.  Edema: Trace  Mental Status:  Normal mood and affect. Normal behavior. Normal judgment and thought content.   Assessment and Plan:  Pregnancy: G3P2002 at 7221w0d  1. Supervision of other normal pregnancy, antepartum -Encouraged patient to increase water intake while at work and wear a maternity support band -Colposcopy not done today, will schedule patient with provider able to do procedure next visit  - Flu Vaccine QUAD 36+ mos IM - US MFM OB COMP + 14 WK;  Future- scheduled 04/25/17 - AFP TETRA  Preterm labor symptoms and general obstetric precautions including but not limited to vaginal bleeding, contractions, leaking of fluid and fetal movement were reviewed in detail with the patient. Please refer to After Visit Summary for other counseling recommendations.  Return in about 4 weeks (around 05/09/2017) for ROB.   Rolm BookbinderCaroline M Harding Thomure, CNM  04/11/17 1:57 PM

## 2017-04-14 LAB — AFP TETRA
DIA Mom Value: 0.99
DIA Value (EIA): 167.43 pg/mL
DSR (BY AGE) 1 IN: 885
DSR (SECOND TRIMESTER) 1 IN: 602
Gestational Age: 17 WEEKS
MSAFP MOM: 1.21
MSAFP: 51 ng/mL
MSHCG MOM: 2.05
MSHCG: 65993 m[IU]/mL
Maternal Age At EDD: 27.6 yr
Osb Risk: 10000
TEST RESULTS AFP: NEGATIVE
WEIGHT: 146 [lb_av]
uE3 Mom: 0.5
uE3 Value: 0.51 ng/mL

## 2017-04-17 ENCOUNTER — Encounter (HOSPITAL_COMMUNITY): Payer: Self-pay | Admitting: Advanced Practice Midwife

## 2017-04-22 ENCOUNTER — Encounter (HOSPITAL_COMMUNITY): Payer: Self-pay

## 2017-04-22 ENCOUNTER — Inpatient Hospital Stay (HOSPITAL_COMMUNITY)
Admission: AD | Admit: 2017-04-22 | Discharge: 2017-04-22 | Disposition: A | Discharge status: Home / Self Care | Payer: Medicaid Other | Source: Ambulatory Visit | Attending: Family Medicine | Admitting: Family Medicine

## 2017-04-22 DIAGNOSIS — O99012 Anemia complicating pregnancy, second trimester: Secondary | ICD-10-CM | POA: Insufficient documentation

## 2017-04-22 DIAGNOSIS — Z87891 Personal history of nicotine dependence: Secondary | ICD-10-CM | POA: Diagnosis not present

## 2017-04-22 DIAGNOSIS — D573 Sickle-cell trait: Secondary | ICD-10-CM | POA: Diagnosis not present

## 2017-04-22 DIAGNOSIS — O98812 Other maternal infectious and parasitic diseases complicating pregnancy, second trimester: Secondary | ICD-10-CM | POA: Insufficient documentation

## 2017-04-22 DIAGNOSIS — Z3A18 18 weeks gestation of pregnancy: Secondary | ICD-10-CM | POA: Insufficient documentation

## 2017-04-22 DIAGNOSIS — Z79899 Other long term (current) drug therapy: Secondary | ICD-10-CM | POA: Insufficient documentation

## 2017-04-22 DIAGNOSIS — O99512 Diseases of the respiratory system complicating pregnancy, second trimester: Secondary | ICD-10-CM | POA: Diagnosis not present

## 2017-04-22 DIAGNOSIS — A749 Chlamydial infection, unspecified: Secondary | ICD-10-CM | POA: Diagnosis not present

## 2017-04-22 DIAGNOSIS — R51 Headache: Secondary | ICD-10-CM | POA: Diagnosis not present

## 2017-04-22 DIAGNOSIS — O26892 Other specified pregnancy related conditions, second trimester: Secondary | ICD-10-CM

## 2017-04-22 LAB — URINALYSIS, ROUTINE W REFLEX MICROSCOPIC
Bilirubin Urine: NEGATIVE
GLUCOSE, UA: NEGATIVE mg/dL
HGB URINE DIPSTICK: NEGATIVE
KETONES UR: 40 mg/dL — AB
Nitrite: NEGATIVE
PH: 6 (ref 5.0–8.0)
Protein, ur: NEGATIVE mg/dL
Specific Gravity, Urine: 1.02 (ref 1.005–1.030)

## 2017-04-22 LAB — URINALYSIS, MICROSCOPIC (REFLEX)

## 2017-04-22 MED ORDER — BUTALBITAL-APAP-CAFFEINE 50-325-40 MG PO TABS
2.0000 | ORAL_TABLET | Freq: Once | ORAL | Status: AC
  Administered 2017-04-22: 2 via ORAL
  Filled 2017-04-22: qty 2

## 2017-04-22 MED ORDER — BUTALBITAL-APAP-CAFFEINE 50-325-40 MG PO TABS: 1.0000 | ORAL_TABLET | Freq: Four times a day (QID) | ORAL | 0 refills | Status: DC | PRN

## 2017-04-22 NOTE — Discharge Instructions (Signed)

## 2017-04-22 NOTE — MAU Provider Note (Signed)
History     CSN: [id number] Arrival date and time: 04/22/17 1421  First Provider Initiated Contact with Patient 04/22/17 1508    Chief Complaint  Patient presents with  . Headache    HPI: Dana Mcconnell is a 26 y.o. [id number] with IUP at [redacted]w[redacted]d who presents to maternity admissions reporting headache for 3 days, which has been unrelieved with Tylenol. She reports history of migraines and has had frequent migraines lasting a few days. Reports currently has frontal headache, rated 5/10, down from 8/10 earlier today. Denies any associated symptoms, including no visual disturbances, nausea, vomiting, numbness, paresthesias, weakness, fever, chills, or URI symptoms.   She receives [facility]. Pregnancy complicated by history of asthma, ASCUS pap, and hx of prior C/S.  Past obstetric history: OB History  Gravida Para Term Preterm AB Living  3 2 2  0 0 2  SAB TAB Ectopic Multiple Live Births  0 0 0 0 2    # Outcome Date GA Lbr Len/2nd Weight Sex Delivery Anes PTL Lv  3 Current           2 Term 05/02/13 [redacted]w[redacted]d  8 lb 11.7 oz (3.96 kg) M CS-LTranv EPI  LIV  1 Term 04/04/11 [redacted]w[redacted]d 05:55 / 01:07 6 lb 8 oz (2.948 kg) F Vag-Spont EPI  LIV      Past Medical History:  Diagnosis Date  . Asthma    inhaler prn  . Chlamydia   . Sickle cell trait [facility])     Past Surgical History:  Procedure Laterality Date  . HERNIA REPAIR    . KNEE DISLOCATION SURGERY  2009   left  . TONSILLECTOMY    . UMBILICAL HERNIA REPAIR     > 10 yrs ago     Family History  Problem Relation Age of Onset  . Asthma Maternal Grandmother   . Diabetes Maternal Grandfather   . Anesthesia problems Neg Hx   . Hypotension Neg Hx   . Malignant hyperthermia Neg Hx   . Pseudochol deficiency Neg Hx   . Alcohol abuse Neg Hx   . Arthritis Neg Hx   . Birth defects Neg Hx   . COPD Neg Hx   . Cancer Neg Hx   . Depression Neg Hx   . Drug abuse Neg Hx   . Early death Neg Hx   . Hearing loss Neg Hx   . Heart disease Neg  Hx   . Hyperlipidemia Neg Hx   . Hypertension Neg Hx   . Kidney disease Neg Hx   . Learning disabilities Neg Hx   . Mental illness Neg Hx   . Mental retardation Neg Hx   . Miscarriages / Stillbirths Neg Hx   . Stroke Neg Hx   . Vision loss Neg Hx     Social History   Tobacco Use  . Smoking status: Former Smoker    Last attempt to quit: 08/21/2010    Years since quitting: 6.6  . Smokeless tobacco: Never Used  Substance Use Topics  . Alcohol use: No  . Drug use: No    Comment: 6 months ago    Allergies: No Known Allergies  Medications Prior to Admission  Medication Sig Dispense Refill Last Dose  . albuterol (PROVENTIL HFA;VENTOLIN HFA) 108 (90 BASE) MCG/ACT inhaler Inhale 2 puffs into the lungs every 6 (six) hours as needed for wheezing or shortness of breath.   Taking  . benzonatate (TESSALON) 100 MG capsule Take 1 capsule (100 mg  total) by mouth every 8 (eight) hours. (Patient not taking: Reported on 03/29/2016) 15 capsule 0 Not Taking  . clindamycin (CLEOCIN) 150 MG capsule Take 2 capsules (300 mg total) by mouth 3 (three) times daily. (Patient not taking: Reported on 03/29/2016) 42 capsule 0 Not Taking  . naproxen (NAPROSYN) 500 MG tablet Take 1 tablet (500 mg total) by mouth 2 (two) times daily with a meal. (Patient not taking: Reported on 02/13/2017) 30 tablet 0 Not Taking  . naproxen (NAPROSYN) 500 MG tablet Take 1 tablet (500 mg total) by mouth 2 (two) times daily with a meal. (Patient not taking: Reported on 02/13/2017) 20 tablet 0 Not Taking  . penicillin v potassium (VEETID) 500 MG tablet Take 1 tablet (500 mg total) by mouth 4 (four) times daily. (Patient not taking: Reported on 02/13/2017) 40 tablet 0 Not Taking  . Prenatal Multivit-Min-Fe-FA (PRENATAL VITAMINS) 0.8 MG tablet Take 1 tablet by mouth daily. 30 tablet 12 Taking  . Prenatal Vit-Fe Fumarate-FA (PRENATAL MULTIVITAMIN) TABS tablet Take 1 tablet by mouth daily at 12 noon.   Not Taking    Review of Systems -  Negative except for what is mentioned in HPI.  Physical Exam   Blood pressure (!) 101/51, pulse 66, temperature 98.2 F (36.8 C), temperature source Oral, resp. rate 16, last menstrual period 12/06/2016, currently breastfeeding.  Constitutional: Well-developed, well-nourished female in no acute distress.  HENT: [place]/AT, normal oropharynx mucosa. MMM Eyes: normal conjunctivae, no scleral icterus, PERRRL Cardiovascular: normal rate Respiratory: normal effort; no respiratory distress GI: Abd soft, non-tender, gravid appropriate for gestational age.   MSK: Extremities nontender, no edema, normal ROM Neurologic: Alert and oriented x 4. No gross CN deficit. Moving all 4 extremities appropriately Psych: Normal mood and affect Skin: warm and dry   FHT:  154   MAU Course/MDM:  Procedures  Pt seen and examined. VS reviewed, wnl.  PO Fioricet and po fluids given. Sx improved on re-evaluation.   Assessment and Plan  Assessment: 1. Pregnancy headache in second trimester     Plan: --Discharge home in stable condition.  --Rx for Fioricet prn --Advised to increase water intake and avoid migraine triggers --Reviewed return precautions.    Degele, Kandra NicolasJulie P, MD 04/22/2017 4:57 PM

## 2017-04-22 NOTE — MAU Note (Signed)
Pt C/O HA's since Friday, last week had one for 4 days.  Hx of migraines, taking tylenol but it isn't working.  Has mild occasional abd pain, denies bleeding.

## 2017-04-25 ENCOUNTER — Ambulatory Visit (HOSPITAL_COMMUNITY)
Admission: RE | Admit: 2017-04-25 | Discharge: 2017-04-25 | Disposition: A | Discharge status: Home / Self Care | Payer: Medicaid Other | Source: Ambulatory Visit | Attending: Advanced Practice Midwife | Admitting: Advanced Practice Midwife

## 2017-04-25 ENCOUNTER — Other Ambulatory Visit: Payer: Self-pay | Admitting: Advanced Practice Midwife

## 2017-04-25 DIAGNOSIS — Z3480 Encounter for supervision of other normal pregnancy, unspecified trimester: Secondary | ICD-10-CM

## 2017-04-25 DIAGNOSIS — Z363 Encounter for antenatal screening for malformations: Secondary | ICD-10-CM | POA: Insufficient documentation

## 2017-04-25 DIAGNOSIS — D573 Sickle-cell trait: Secondary | ICD-10-CM

## 2017-04-25 DIAGNOSIS — Z3A19 19 weeks gestation of pregnancy: Secondary | ICD-10-CM

## 2017-04-25 DIAGNOSIS — Z98891 History of uterine scar from previous surgery: Secondary | ICD-10-CM | POA: Insufficient documentation

## 2017-05-02 ENCOUNTER — Encounter: Payer: Medicaid Other | Admitting: Obstetrics & Gynecology

## 2017-05-14 ENCOUNTER — Ambulatory Visit (INDEPENDENT_AMBULATORY_CARE_PROVIDER_SITE_OTHER): Payer: Medicaid Other | Admitting: Clinical

## 2017-05-14 ENCOUNTER — Ambulatory Visit (INDEPENDENT_AMBULATORY_CARE_PROVIDER_SITE_OTHER): Payer: Medicaid Other | Admitting: Advanced Practice Midwife

## 2017-05-14 VITALS — BP 105/61 | HR 79 | Wt 158.8 lb

## 2017-05-14 DIAGNOSIS — Z3482 Encounter for supervision of other normal pregnancy, second trimester: Secondary | ICD-10-CM

## 2017-05-14 DIAGNOSIS — F32A Depression, unspecified: Secondary | ICD-10-CM

## 2017-05-14 DIAGNOSIS — O99342 Other mental disorders complicating pregnancy, second trimester: Secondary | ICD-10-CM

## 2017-05-14 DIAGNOSIS — Z3480 Encounter for supervision of other normal pregnancy, unspecified trimester: Secondary | ICD-10-CM

## 2017-05-14 DIAGNOSIS — F4323 Adjustment disorder with mixed anxiety and depressed mood: Secondary | ICD-10-CM | POA: Diagnosis not present

## 2017-05-14 DIAGNOSIS — F3289 Other specified depressive episodes: Secondary | ICD-10-CM

## 2017-05-14 DIAGNOSIS — O99340 Other mental disorders complicating pregnancy, unspecified trimester: Secondary | ICD-10-CM

## 2017-05-14 DIAGNOSIS — F329 Major depressive disorder, single episode, unspecified: Secondary | ICD-10-CM | POA: Insufficient documentation

## 2017-05-14 DIAGNOSIS — F1290 Cannabis use, unspecified, uncomplicated: Secondary | ICD-10-CM

## 2017-05-14 NOTE — Patient Instructions (Addendum)
Places to have your son circumcised:    [facility] [phone] $480 while you are in hospital  [facility] [phone] $244 by 4 wks  [facility] [phone] $175 by 2 wks  [facility] [phone] $250 by 7 days [facility] [phone] $150 by 4 wks  These prices sometimes change but are roughly what you can expect to pay. Please call and confirm pricing.   Circumcision is considered an elective/non-medically necessary procedure. There are many reasons parents decide to have their sons circumsized. During the first year of life circumcised males have a reduced risk of urinary tract infections but after this year the rates between circumcised males and uncircumcised males are the same.  It is safe to have your son circumcised outside of the hospital and the places above perform them regularly.   Perinatal Depression When a woman feels excessive sadness, anger, or anxiety during pregnancy or during the first 12 months after she gives birth, she has a condition called perinatal depression. Depression can interfere with work, school, relationships, and other everyday activities. If it is not managed properly, it can also cause problems in the mother and her baby. Sometimes, perinatal depression is left untreated because symptoms are thought to be normal mood swings during and right after pregnancy. If you have symptoms of depression, it is important to talk with your health care provider. What are the causes? The exact cause of this condition is not known. Hormonal changes during and after pregnancy may play a role in causing perinatal depression. What increases the risk? You are more likely to develop this condition if:  You have a personal or family history of depression, anxiety, or mood disorders.  You experience a stressful life event  during pregnancy, such as the death of a loved one.  You have a lot of regular life stress.  You do not have support from family members or loved ones, or you are in an abusive relationship.  What are the signs or symptoms? Symptoms of this condition include:  Feeling sad or hopeless.  Feelings of guilt.  Feeling irritable or overwhelmed.  Changes in your appetite.  Lack of energy or motivation.  Sleep problems.  Difficulty concentrating or completing tasks.  Loss of interest in hobbies or relationships.  Headaches or stomach problems that do not go away.  How is this diagnosed? This condition is diagnosed based on a physical exam and mental evaluation. In some cases, your health care provider may use a depression screening tool. These tools include a list of questions that can help a health care provider diagnose depression. Your health care provider may refer you to a mental health expert who specializes in depression. How is this treated? This condition may be treated with:  Medicines. Your health care provider will only give you medicines that have been proven safe for pregnancy and breastfeeding.  Talk therapy with a mental health professional to help change your patterns of thinking (cognitive behavioral therapy).  Support groups.  Brain stimulation or light therapies.  Stress reduction therapies, such as mindfulness.  Follow these instructions at home: Lifestyle  Do not use any products that contain nicotine or tobacco, such as cigarettes and e-cigarettes. If you need help quitting, ask your health care provider.  Do not use alcohol when you are pregnant. After your baby is born, limit alcohol intake to no more than 1 drink a day. One drink equals 12 oz of beer, 5 oz of wine, or 1 oz of hard liquor.  Consider joining  a support group for new mothers. Ask your health care provider for recommendations.  Take good care of yourself. Make sure you: ? Get plenty  of sleep. If you are having trouble sleeping, talk with your health care provider. ? Eat a healthy diet. This includes plenty of fruits and vegetables, whole grains, and lean proteins. ? Exercise regularly, as told by your health care provider. Ask your health care provider what exercises are safe for you. General instructions  Take over-the-counter and prescription medicines only as told by your health care provider.  Talk with your partner or family members about your feelings during pregnancy. Share any concerns or anxieties that you may have.  Ask for help with tasks or chores when you need it. Ask friends and family members to provide meals, watch your children, or help with cleaning.  Keep all follow-up visits as told by your health care provider. This is important. Contact a health care provider if:  You (or people close to you) notice that you have any symptoms of depression.  You have depression and your symptoms get worse.  You experience side effects from medicines, such as nausea or sleep problems. Get help right away if:  You feel like hurting yourself, your baby, or someone else. If you ever feel like you may hurt yourself or others, or have thoughts about taking your own life, get help right away. You can go to your nearest emergency department or call:  Your local emergency services (911 in the [place]  A suicide crisis helpline, such as the National Suicide Prevention Lifeline at 47815705501-(772) 167-9209. This is open 24 hours a day.  Summary  Perinatal depression is when a woman feels excessive sadness, anger, or anxiety during pregnancy or during the first 12 months after she gives birth.  If perinatal depression is not treated, it can lead to health problems for the mother and her baby.  This condition is treated with medicines, talk therapy, stress reduction therapies, or a combination of two or more treatments.  Talk with your partner or family members about your  feelings. Do not be afraid to ask for help. This information is not intended to replace advice given to you by your health care provider. Make sure you discuss any questions you have with your health care provider. Document Released: 08/02/2016 Document Revised: 08/02/2016 Document Reviewed: 08/02/2016 Elsevier Interactive Patient Education  [employer]2018 Elsevier Inc.

## 2017-05-14 NOTE — BH Specialist Note (Addendum)
Integrated Behavioral Health Initial Visit  MRN: [id number] Name: Dana BodilyJertica S Halfhill  Number of Integrated Behavioral Health Clinician visits:: 1/6 Session Start time: 3:10  Session End time: 3:35 Total time: 30 minutes  Type of Service: Integrated Behavioral Health- Individual/Family Interpretor:No. Interpretor Name and Language: n/a   Warm Hand Off Completed.       SUBJECTIVE: Dana Mcconnell is a 26 y.o. female accompanied by n/a Patient was referred by Thressa ShellerHeather Hogan, CNM for depression and anxiety. Patient reports the following symptoms/concerns: Pt states her primary concern today is fatigue, difficulty falling asleep, low appetite, being irritable upon waking, headaches and feeling cold and dehydrated, as well as missing her mother, who passed when pt was an infant.  Pt also concerned about hypertension and "other life stuff". Pt is able to sleep best by opening window to hear outdoor sounds. Duration of problem: Current pregnancy; Severity of problem: moderate  OBJECTIVE: Mood: Depressed and Affect: Depressed Risk of harm to self or others: No plan to harm self or others  LIFE CONTEXT: Family and Social: Pt lives with 4yo son and 6yo daughter School/Work: Works fulltime([employer]) Self-Care: - Life Changes: Current pregnancy; undermined additional life stress  GOALS ADDRESSED: Patient will: 1. Reduce symptoms of: anxiety and depression 2. Increase knowledge and/or ability of: coping skills  3. Demonstrate ability to: Increase healthy adjustment to current life circumstances  INTERVENTIONS: Interventions utilized: Mindfulness or Management consultantelaxation Training, Psychoeducation and/or Health Education and Link to [facility]  Standardized Assessments completed: GAD-7 and PHQ 9  ASSESSMENT: Patient currently experiencing Adjustment disorder with mixed anxious and depressed mood.   Patient may benefit from psychoeducation and brief  therapeutic intervention regarding coping with symptoms of anxiety and depression.  PLAN: 1. Follow up with behavioral health clinician on : Two weeks, or next medical visit(discuss with medical provider about taking iron pills) 2. Behavioral recommendations:  -Sleep app on phone nightly at bedtime for improved family sleep -Consider increasing water intake by one extra water bottle/day -Consider CALM relaxation breathing exercise daily, as needed -Read educational material regarding coping with symptoms of anxiety and depression, with panic attack 3. Referral(s): [facility] 4. "From scale of 1-10, how likely are you to follow plan?": 8  Rae LipsJamie C Trelon Plush, LCSW   Depression screen [facility] 2/9 05/14/2017  Decreased Interest 2  Down, Depressed, Hopeless 2  PHQ - 2 Score 4  Altered sleeping 3  Tired, decreased energy 2  Change in appetite 0  Feeling bad or failure about yourself  1  Suicidal thoughts 0  PHQ-9 Score 10   GAD 7 : Generalized Anxiety Score 05/14/2017  Nervous, Anxious, on Edge 1  Control/stop worrying 2  Worry too much - different things 2  Trouble relaxing 2  Restless 2  Easily annoyed or irritable 3  Afraid - awful might happen 1  Total GAD 7 Score 13

## 2017-05-14 NOTE — Progress Notes (Signed)
Pt c/o frequent headaches, is almost out of Fiorcet. Also has low back pain. PHQ-9 13, GAD-7 13. Patient and/or legal guardian verbally consented to meet with Behavioral Health Clinician about presenting concerns.

## 2017-05-14 NOTE — Progress Notes (Signed)
   PRENATAL VISIT NOTE  Subjective:  Dana Mcconnell is a 26 y.o. G3P2002 at 5935w5d being seen today for ongoing prenatal care.  She is currently monitored for the following issues for this low-risk pregnancy and has Supervision of other normal pregnancy, antepartum; Sickle cell trait (HCC); Previous cesarean section; Asthma; and ASCUS with positive high risk HPV cervical on their problem list.  Patient reports depression . She reports that she has had depression in the past. She has not been on medication for depression in the past.   Contractions: Not present. Vag. Bleeding: None.  Movement: Present. Denies leaking of fluid.   Patient also reports that she can only eat if she smokes marijuana, and she has "one puff per day" in order to help her eat.   The following portions of the patient's history were reviewed and updated as appropriate: allergies, current medications, past family history, past medical history, past social history, past surgical history and problem list. Problem list updated.  Objective:   Vitals:   05/14/17 1428  BP: 105/61  Pulse: 79  Weight: 158 lb 12.8 oz (72 kg)    Fetal Status: Fetal Heart Rate (bpm): 150 Fundal Height: 21 cm Movement: Present     General:  Alert, oriented and cooperative. Patient is in no acute distress.  Skin: Skin is warm and dry. No rash noted.   Cardiovascular: Normal heart rate noted  Respiratory: Normal respiratory effort, no problems with respiration noted  Abdomen: Soft, gravid, appropriate for gestational age.  Pain/Pressure: Absent     Pelvic: Cervical exam deferred        Extremities: Normal range of motion.  Edema: None  Mental Status:  Normal mood and affect. Normal behavior. Normal judgment and thought content.   Assessment and Plan:  Pregnancy: G3P2002 at 7635w5d  1. Supervision of other normal pregnancy, antepartum - US MFM OB FOLLOW UP; Future - DW patient that safety of marijuana in pregnancy is unknown at this time,  and we would encourage her to consider other ways to increase appetite.    - Colpo not done. Will need to schedule next appt with someone that can perform colpo.   2. Perinatal depression - See Dana Mcconnell today, consider meds pending consult with Dana Mcconnell.   Preterm labor symptoms and general obstetric precautions including but not limited to vaginal bleeding, contractions, leaking of fluid and fetal movement were reviewed in detail with the patient. Please refer to After Visit Summary for other counseling recommendations.  Return in about 4 weeks (around 06/11/2017).   Dana Mcconnell, CNM

## 2017-05-22 ENCOUNTER — Encounter: Payer: Self-pay | Admitting: General Practice

## 2017-05-22 ENCOUNTER — Ambulatory Visit (INDEPENDENT_AMBULATORY_CARE_PROVIDER_SITE_OTHER): Payer: Medicaid Other | Admitting: Medical

## 2017-05-22 ENCOUNTER — Encounter: Payer: Self-pay | Admitting: Medical

## 2017-05-22 VITALS — BP 111/60 | HR 91 | Wt 159.3 lb

## 2017-05-22 DIAGNOSIS — R87810 Cervical high risk human papillomavirus (HPV) DNA test positive: Secondary | ICD-10-CM

## 2017-05-22 DIAGNOSIS — J4520 Mild intermittent asthma, uncomplicated: Secondary | ICD-10-CM | POA: Diagnosis not present

## 2017-05-22 DIAGNOSIS — Z3482 Encounter for supervision of other normal pregnancy, second trimester: Secondary | ICD-10-CM

## 2017-05-22 DIAGNOSIS — G44209 Tension-type headache, unspecified, not intractable: Secondary | ICD-10-CM | POA: Diagnosis not present

## 2017-05-22 DIAGNOSIS — R87610 Atypical squamous cells of undetermined significance on cytologic smear of cervix (ASC-US): Secondary | ICD-10-CM | POA: Diagnosis not present

## 2017-05-22 DIAGNOSIS — Z3480 Encounter for supervision of other normal pregnancy, unspecified trimester: Secondary | ICD-10-CM

## 2017-05-22 MED ORDER — BUTALBITAL-APAP-CAFFEINE 50-325-40 MG PO TABS: 1.0000 | ORAL_TABLET | Freq: Four times a day (QID) | ORAL | 0 refills | Status: DC | PRN

## 2017-05-22 MED ORDER — ALBUTEROL SULFATE HFA 108 (90 BASE) MCG/ACT IN AERS: 2.0000 | INHALATION_SPRAY | Freq: Four times a day (QID) | RESPIRATORY_TRACT | 0 refills | Status: DC | PRN

## 2017-05-22 NOTE — Progress Notes (Signed)
    GYNECOLOGY CLINIC COLPOSCOPY PROCEDURE NOTE  Ms. Dana Mcconnell is a 26 y.o. 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 Discussed role for HPV in cervical dysplasia, need for surveillance.  Patient given informed consent, signed copy in the chart, time out was performed.  Placed in lithotomy position. Cervix viewed with speculum and colposcope after application of acetic acid.   Colposcopy adequate? Yes  mild acetowhite lesion(s) noted at 12 o'clock; biopsies deferred due to pregnancy.    Patient was given post procedure instructions. Patient to return for routine OB follow-up in 4 weeks with fasting labs at that time.  Preterm labor symptoms and general obstetric precautions including but not limited to vaginal bleeding, contractions, leaking of fluid and fetal movement were reviewed in detail with the patient. Please refer to After Visit Summary for other counseling recommendations.    Marny LowensteinWenzel, Julie N, PA-C 05/22/2017 4:34 PM

## 2017-05-22 NOTE — Patient Instructions (Signed)
Sciatica Sciatica is pain, numbness, weakness, or tingling along your sciatic nerve. The sciatic nerve starts in the lower back and goes down the back of each leg. Sciatica happens when this nerve is pinched or has pressure put on it. Sciatica usually goes away on its own or with treatment. Sometimes, sciatica may keep coming back (recur). Follow these instructions at home: Medicines  Take over-the-counter and prescription medicines only as told by your doctor.  Do not drive or use heavy machinery while taking prescription pain medicine. Managing pain  If directed, put ice on the affected area. ? Put ice in a plastic bag. ? Place a towel between your skin and the bag. ? Leave the ice on for 20 minutes, 2-3 times a day.  After icing, apply heat to the affected area before you exercise or as often as told by your doctor. Use the heat source that your doctor tells you to use, such as a moist heat pack or a heating pad. ? Place a towel between your skin and the heat source. ? Leave the heat on for 20-30 minutes. ? Remove the heat if your skin turns bright red. This is especially important if you are unable to feel pain, heat, or cold. You may have a greater risk of getting burned. Activity  Return to your normal activities as told by your doctor. Ask your doctor what activities are safe for you. ? Avoid activities that make your sciatica worse.  Take short rests during the day. Rest in a lying or standing position. This is usually better than sitting to rest. ? When you rest for a long time, do some physical activity or stretching between periods of rest. ? Avoid sitting for a long time without moving. Get up and move around at least one time each hour.  Exercise and stretch regularly, as told by your doctor.  Do not lift anything that is heavier than 10 lb (4.5 kg) while you have symptoms of sciatica. ? Avoid lifting heavy things even when you do not have symptoms. ? Avoid lifting heavy  things over and over.  When you lift objects, always lift in a way that is safe for your body. To do this, you should: ? Bend your knees. ? Keep the object close to your body. ? Avoid twisting. General instructions  Use good posture. ? Avoid leaning forward when you are sitting. ? Avoid hunching over when you are standing.  Stay at a healthy weight.  Wear comfortable shoes that support your feet. Avoid wearing high heels.  Avoid sleeping on a mattress that is too soft or too hard. You might have less pain if you sleep on a mattress that is firm enough to support your back.  Keep all follow-up visits as told by your doctor. This is important. Contact a doctor if:  You have pain that: ? Wakes you up when you are sleeping. ? Gets worse when you lie down. ? Is worse than the pain you have had in the past. ? Lasts longer than 4 weeks.  You lose weight for without trying. Get help right away if:  You cannot control when you pee (urinate) or poop (have a bowel movement).  You have weakness in any of these areas and it gets worse. ? Lower back. ? Lower belly (pelvis). ? Butt (buttocks). ? Legs.  You have redness or swelling of your back.  You have a burning feeling when you pee. This information is not intended to replace   advice given to you by your health care provider. Make sure you discuss any questions you have with your health care provider. Document Released: 03/14/2008 Document Revised: 11/11/2015 Document Reviewed: 02/12/2015 Elsevier Interactive Patient Education  2018 [employer] Second Trimester of Pregnancy The second trimester is from week 13 through week 28, month 4 through 6. This is often the time in pregnancy that you feel your best. Often times, morning sickness has lessened or quit. You may have more energy, and you may get hungry more often. Your unborn baby (fetus) is growing rapidly. At the end of the sixth month, he or she is about 9 inches long and  weighs about 1 pounds. You will likely feel the baby move (quickening) between 18 and 20 weeks of pregnancy. Follow these instructions at home:  Avoid all smoking, herbs, and alcohol. Avoid drugs not approved by your doctor.  Do not use any tobacco products, including cigarettes, chewing tobacco, and electronic cigarettes. If you need help quitting, ask your doctor. You may get counseling or other support to help you quit.  Only take medicine as told by your doctor. Some medicines are safe and some are not during pregnancy.  Exercise only as told by your doctor. Stop exercising if you start having cramps.  Eat regular, healthy meals.  Wear a good support bra if your breasts are tender.  Do not use hot tubs, steam rooms, or saunas.  Wear your seat belt when driving.  Avoid raw meat, uncooked cheese, and liter boxes and soil used by cats.  Take your prenatal vitamins.  Take 1500-2000 milligrams of calcium daily starting at the 20th week of pregnancy until you deliver your baby.  Try taking medicine that helps you poop (stool softener) as needed, and if your doctor approves. Eat more fiber by eating fresh fruit, vegetables, and whole grains. Drink enough fluids to keep your pee (urine) clear or pale yellow.  Take warm water baths (sitz baths) to soothe pain or discomfort caused by hemorrhoids. Use hemorrhoid cream if your doctor approves.  If you have puffy, bulging veins (varicose veins), wear support hose. Raise (elevate) your feet for 15 minutes, 3-4 times a day. Limit salt in your diet.  Avoid heavy lifting, wear low heals, and sit up straight.  Rest with your legs raised if you have leg cramps or low back pain.  Visit your dentist if you have not gone during your pregnancy. Use a soft toothbrush to brush your teeth. Be gentle when you floss.  You can have sex (intercourse) unless your doctor tells you not to.  Go to your doctor visits. Get help if:  You feel dizzy.  You  have mild cramps or pressure in your lower belly (abdomen).  You have a nagging pain in your belly area.  You continue to feel sick to your stomach (nauseous), throw up (vomit), or have watery poop (diarrhea).  You have bad smelling fluid coming from your vagina.  You have pain with peeing (urination). Get help right away if:  You have a fever.  You are leaking fluid from your vagina.  You have spotting or bleeding from your vagina.  You have severe belly cramping or pain.  You lose or gain weight rapidly.  You have trouble catching your breath and have chest pain.  You notice sudden or extreme puffiness (swelling) of your face, hands, ankles, feet, or legs.  You have not felt the baby move in over an hour.  You have severe headaches  that do not go away with medicine.  You have vision changes. This information is not intended to replace advice given to you by your health care provider. Make sure you discuss any questions you have with your health care provider. Document Released: 08/30/2009 Document Revised: 11/11/2015 Document Reviewed: 08/06/2012 Elsevier Interactive Patient Education  2017 [employer]

## 2017-05-30 ENCOUNTER — Ambulatory Visit (HOSPITAL_COMMUNITY)
Admission: RE | Admit: 2017-05-30 | Discharge: 2017-05-30 | Disposition: A | Discharge status: Home / Self Care | Payer: Medicaid Other | Source: Ambulatory Visit | Attending: Advanced Practice Midwife | Admitting: Advanced Practice Midwife

## 2017-05-30 DIAGNOSIS — Z362 Encounter for other antenatal screening follow-up: Secondary | ICD-10-CM | POA: Diagnosis present

## 2017-05-30 DIAGNOSIS — O99012 Anemia complicating pregnancy, second trimester: Secondary | ICD-10-CM | POA: Insufficient documentation

## 2017-05-30 DIAGNOSIS — O09892 Supervision of other high risk pregnancies, second trimester: Secondary | ICD-10-CM | POA: Insufficient documentation

## 2017-05-30 DIAGNOSIS — Z3480 Encounter for supervision of other normal pregnancy, unspecified trimester: Secondary | ICD-10-CM

## 2017-05-30 DIAGNOSIS — D573 Sickle-cell trait: Secondary | ICD-10-CM | POA: Diagnosis not present

## 2017-05-30 DIAGNOSIS — Z3A24 24 weeks gestation of pregnancy: Secondary | ICD-10-CM | POA: Diagnosis not present

## 2017-06-06 ENCOUNTER — Telehealth: Payer: Self-pay | Admitting: General Practice

## 2017-06-06 NOTE — Telephone Encounter (Signed)
Patient called and left message that was difficult to hear, sounds like she is calling regarding cramps in her legs. Called patient, no answer- left message stating we are trying to reach you to return your phone call, please call us back if you still need assistance

## 2017-06-06 NOTE — Telephone Encounter (Signed)
Patient called into front office requesting a call back. Called patient and she states she has been having cramping and wants to know what she can do besides go to the hospital. Asked patient more about her cramping. Patient states it comes as sharp pains at times in her lower belly especially when she is working & it comes and goes like contractions. Recommended if she is having contractions she try laying down/sitting down and resting and drinking A LOT of water. Told patient if she is having contractions every 4-5 minutes for more than an hour, she should come in to be seen. Patient verbalized understanding & asked what can she do for the pain in her legs she feels from standing or walking around too much. Told patient a lot of that is normal pregnancy symptoms but she may find relief from a maternity support belt. Patient verbalized understanding & had no questions

## 2017-06-13 ENCOUNTER — Ambulatory Visit (INDEPENDENT_AMBULATORY_CARE_PROVIDER_SITE_OTHER): Payer: Medicaid Other

## 2017-06-13 ENCOUNTER — Other Ambulatory Visit: Payer: Medicaid Other

## 2017-06-13 VITALS — BP 110/69 | HR 96 | Wt 159.6 lb

## 2017-06-13 DIAGNOSIS — Z3480 Encounter for supervision of other normal pregnancy, unspecified trimester: Secondary | ICD-10-CM

## 2017-06-13 DIAGNOSIS — Z23 Encounter for immunization: Secondary | ICD-10-CM | POA: Diagnosis present

## 2017-06-13 DIAGNOSIS — Z3482 Encounter for supervision of other normal pregnancy, second trimester: Secondary | ICD-10-CM

## 2017-06-13 DIAGNOSIS — Z98891 History of uterine scar from previous surgery: Secondary | ICD-10-CM

## 2017-06-13 NOTE — Progress Notes (Signed)
28 wk labs/tdap today 

## 2017-06-13 NOTE — Progress Notes (Addendum)
   PRENATAL VISIT NOTE  Subjective:  Dana Mcconnell is a 26 y.o. G3P2002 at 7636w0d being seen today for ongoing prenatal care.  She is currently monitored for the following issues for this low-risk pregnancy and has Supervision of other normal pregnancy, antepartum; Sickle cell trait (HCC); Previous cesarean section; Asthma; ASCUS with positive high risk HPV cervical; Marijuana use; and Depression on their problem list.  Patient reports no complaints.  Contractions: Not present. Vag. Bleeding: None.  Movement: Present. Denies leaking of fluid.   The following portions of the patient's history were reviewed and updated as appropriate: allergies, current medications, past family history, past medical history, past social history, past surgical history and problem list. Problem list updated.  Objective:   Vitals:   06/13/17 1403  BP: 110/69  Pulse: 96  Weight: 159 lb 9.6 oz (72.4 kg)    Fetal Status: Fetal Heart Rate (bpm): 158 Fundal Height: 26 cm Movement: Present     General:  Alert, oriented and cooperative. Patient is in no acute distress.  Skin: Skin is warm and dry. No rash noted.   Cardiovascular: Normal heart rate noted  Respiratory: Normal respiratory effort, no problems with respiration noted  Abdomen: Soft, gravid, appropriate for gestational age.  Pain/Pressure: Present     Pelvic: Cervical exam deferred        Extremities: Normal range of motion.  Edema: None  Mental Status:  Normal mood and affect. Normal behavior. Normal judgment and thought content.   Assessment and Plan:  Pregnancy: G3P2002 at 4236w0d  1. Supervision of other normal pregnancy, antepartum -2hr GTT and labs done today. -Patient desires permanent sterilization PP. Discussed with patient method of procedure as well as risks and benefits. Patient signs BTL paperwork today. -Patient upset about wait time. States she has been here since 1045 this morning and no one came to see her before then. Patient  appointment scheduled for 1pm. Encouraged patient to come at scheduled appointment time and check in if she comes early.   2. Need for diphtheria-tetanus-pertussis (Tdap) vaccine - Tdap vaccine greater than or equal to 7yo IM- done today  3. Hx of cesarean section -Desires TOLAC  Preterm labor symptoms and general obstetric precautions including but not limited to vaginal bleeding, contractions, leaking of fluid and fetal movement were reviewed in detail with the patient. Please refer to After Visit Summary for other counseling recommendations.  Return in about 2 weeks (around 06/27/2017) for Return OB visit.  Rolm BookbinderCaroline M Neill, CNM  06/13/17 2:45 PM

## 2017-06-13 NOTE — Patient Instructions (Signed)
What You Need to Know About Female Sterilization Female sterilization is surgery to prevent pregnancy. In this surgery, the fallopian tubes are either blocked or closed off. This prevents eggs from reaching the uterus so that the eggs cannot be fertilized by sperm and you cannot get pregnant. Sterilization is permanent. It should only be done if you are sure that you do not want to be able to have children. What are the sterilization surgery options? There are several kinds of female sterilization surgeries. They include:  Laparoscopic tubal ligation. In this surgery, the fallopian tubes are tied off, sealed with heat, or blocked with a clip, ring, or clamp. A small portion of each fallopian tube may also be removed. This surgery is done through several small cuts (incisions).  Postpartum tubal ligation. This is also called a mini-laparotomy. This surgery is done right after childbirth or 1 or 2 days after childbirth. In this surgery, the fallopian tubes are tied off, sealed with heat, or blocked with a clip, ring, or clamp. A small portion of each fallopian tube may also be removed. The surgery is done through a single incision.  Hysteroscopic sterilization. In this surgery, a tiny, spring-like coil is inserted through the cervix and uterus into the fallopian tubes. The coil causes scarring, which blocks the tubes. After the surgery, contraception should be used for 3 months to allow the scar tissue to form completely.  Is sterilization safe? Generally, sterilization is safe. Complications are rare. However, there are risks. They include:  Bleeding.  Infection.  Reaction to medicine used during the procedure.  Injury to surrounding organs.  Failure of the procedure.  How effective is sterilization? Sterilization is nearly 100% effective, but it can fail. Also, the fallopian tubes can grow back together over time. If this happens, you will be able to get pregnant again. Women who have had  this procedure have a higher chance of having an ectopic pregnancy. An ectopic pregnancy is a pregnancy that happens outside of the uterus. This kind of pregnancy is unsuccessful and can lead to serious bleeding if it is not treated. What are the benefits?  It is usually effective for a lifetime.  It is usually safe.  It does not have the drawbacks of other types of birth control: That means: ? Your hormones are not affected. Because of this, your menstrual periods, sexual desire, and sexual performance will not be affected. ? There are no side effects. What are the drawbacks?  If you change your mind and decide that you want to have children, you may not be able to. Sterilization may be reversed, but a reversal is not always successful.  It does not provide protection against STDs (sexually transmitted diseases).  It increases the chance of having an ectopic pregnancy. This information is not intended to replace advice given to you by your health care provider. Make sure you discuss any questions you have with your health care provider. Document Released: 11/22/2007 Document Revised: 01/27/2016 Document Reviewed: 03/02/2015 Elsevier Interactive Patient Education  2018 Elsevier Inc.  

## 2017-06-14 LAB — CBC
HEMATOCRIT: 28.2 % — AB (ref 34.0–46.6)
Hemoglobin: 9.3 g/dL — ABNORMAL LOW (ref 11.1–15.9)
MCH: 31.8 pg (ref 26.6–33.0)
MCHC: 33 g/dL (ref 31.5–35.7)
MCV: 97 fL (ref 79–97)
PLATELETS: 206 10*3/uL (ref 150–379)
RBC: 2.92 x10E6/uL — ABNORMAL LOW (ref 3.77–5.28)
RDW: 13.6 % (ref 12.3–15.4)
WBC: 5.7 10*3/uL (ref 3.4–10.8)

## 2017-06-14 LAB — HIV ANTIBODY (ROUTINE TESTING W REFLEX): HIV Screen 4th Generation wRfx: NONREACTIVE

## 2017-06-14 LAB — RPR: RPR Ser Ql: NONREACTIVE

## 2017-06-14 LAB — GLUCOSE TOLERANCE, 2 HOURS W/ 1HR
GLUCOSE, 1 HOUR: 143 mg/dL (ref 65–179)
GLUCOSE, 2 HOUR: 112 mg/dL (ref 65–152)
GLUCOSE, FASTING: 72 mg/dL (ref 65–91)

## 2017-06-19 NOTE — L&D Delivery Note (Addendum)
Delivery Note  Patient is 27 y.o. [id number] 7132w5d admitted for SOL. SROM at 0810. Prenatal course also complicated by sickle cell trait, previous c/s, anemia, depression.  Called to patient room due to prolonged decel to the 80-90s s/p epidural for ~9210min. Unable to come up after conservative measures so code cesarean called. While in the OR patient bearing down. Edematous cervix was able to be pushed out the way. Vacuum extractor placed on infant head and delivered. No nuchal cord present. Shoulder and body delivered in usual fashion. Cord clamped x 2 and infant given over to awaiting NICU team. Cord pH and blood drawn.   Placenta did not deliver after 1hr. Dr. Alysia PennaErvin performed manual extraction of intact placenta. Fundus firm with massage and Pitocin. Perineum inspected and found to have no lacerations.   At 8:55 AM a viable female was delivered via VBAC, Vacuum Assisted (Presentation: ROA).  APGAR: 9, 9; weight pending.  Placenta status: Intact s/p manual extraction.  Cord: 3V with the following complications: one.  Cord pH: 7.27  Anesthesia: Had epidural but not adequate Episiotomy: None Lacerations:  None Est. Blood Loss (mL):  ~700 minimal  Mom to postpartum.  Baby to Couplet care / Skin to Skin.  Caryl AdaJazma Phelps, DO 09/10/2017, 9:56 AM OB Fellow [facility]    OB Attending I was present and scrubbed in for the entire procedure. Manual extraction of placenta without problems. Delivery as documented.   Nettie ElmMichael Maily Debarge, MD

## 2017-07-11 ENCOUNTER — Ambulatory Visit (INDEPENDENT_AMBULATORY_CARE_PROVIDER_SITE_OTHER): Payer: Medicaid Other | Admitting: Medical

## 2017-07-11 VITALS — BP 114/66 | HR 102 | Wt 158.9 lb

## 2017-07-11 DIAGNOSIS — O99013 Anemia complicating pregnancy, third trimester: Secondary | ICD-10-CM

## 2017-07-11 DIAGNOSIS — D649 Anemia, unspecified: Secondary | ICD-10-CM

## 2017-07-11 DIAGNOSIS — Z3480 Encounter for supervision of other normal pregnancy, unspecified trimester: Secondary | ICD-10-CM

## 2017-07-11 MED ORDER — FERROUS SULFATE 325 (65 FE) MG PO TABS: 325.0000 mg | ORAL_TABLET | Freq: Every day | ORAL | 3 refills | Status: DC

## 2017-07-11 NOTE — Progress Notes (Signed)
   PRENATAL VISIT NOTE  Subjective:  Dana Mcconnell is a 27 y.o. G3P2002 at 5380w0d being seen today for ongoing prenatal care.  She is currently monitored for the following issues for this high-risk pregnancy and has Supervision of other normal pregnancy, antepartum; Sickle cell trait (HCC); Previous cesarean section; Asthma; ASCUS with positive high risk HPV cervical; Marijuana use; and Depression on their problem list.  Patient reports no complaints.  Contractions: Irregular. Vag. Bleeding: None.  Movement: Present. Denies leaking of fluid.   The following portions of the patient's history were reviewed and updated as appropriate: allergies, current medications, past family history, past medical history, past social history, past surgical history and problem list. Problem list updated.  Objective:   Vitals:   07/11/17 1415  BP: 114/66  Pulse: (!) 102  Weight: 158 lb 14.4 oz (72.1 kg)    Fetal Status: Fetal Heart Rate (bpm): 142 Fundal Height: 30 cm Movement: Present     General:  Alert, oriented and cooperative. Patient is in no acute distress.  Skin: Skin is warm and dry. No rash noted.   Cardiovascular: Normal heart rate noted  Respiratory: Normal respiratory effort, no problems with respiration noted  Abdomen: Soft, gravid, appropriate for gestational age.  Pain/Pressure: Present     Pelvic: Cervical exam deferred        Extremities: Normal range of motion.  Edema: Trace  Mental Status:  Normal mood and affect. Normal behavior. Normal judgment and thought content.   Assessment and Plan:  Pregnancy: G3P2002 at 2880w0d  1. Supervision of other normal pregnancy, antepartum - Doing well, no complaints - Reviewed last US and 28 week lab results  2. Anemia in pregnancy, third trimester - ferrous sulfate 325 (65 FE) MG tablet; Take 1 tablet (325 mg total) by mouth daily with breakfast.  Dispense: 30 tablet; Refill: 3 - Will recheck at 36 weeks  Preterm labor symptoms and general  obstetric precautions including but not limited to vaginal bleeding, contractions, leaking of fluid and fetal movement were reviewed in detail with the patient. Please refer to After Visit Summary for other counseling recommendations.  Return in about 2 weeks (around 07/25/2017) for LOB.   Vonzella NippleJulie Wenzel, PA-C

## 2017-07-11 NOTE — Progress Notes (Signed)
phq9 elevated- offered for her to see Asher MuirJamie, our behavioral health clinician and she declined for today; states she talked with her before. I informed her she can make an appointment at any tine [facility].

## 2017-07-11 NOTE — Patient Instructions (Addendum)
Places to have your son circumcised:    [facility] [phone] while you are in hospital  [facility] [phone] $244 by 4 wks  [facility] [phone] $175 by 2 wks  [facility] [phone] $250 by 7 days [facility] [phone] $150 by 4 wks  These prices sometimes change but are roughly what you can expect to pay. Please call and confirm pricing.   Circumcision is considered an elective/non-medically necessary procedure. There are many reasons parents decide to have their sons circumsized. During the first year of life circumcised males have a reduced risk of urinary tract infections but after this year the rates between circumcised males and uncircumcised males are the same.  It is safe to have your son circumcised outside of the hospital and the places above perform them regularly.   Deciding about Circumcision in Baby Boys  (Up-to-date The Basics)  What is circumcision?  Circumcision is a surgery that removes the skin that covers the tip of the penis, called the "foreskin" Circumcision is usually done when a boy is between 47 and 69 days old. In the [place], circumcision is common. In some other countries, fewer boys are circumcised. Circumcision is a common tradition in some religions.  Should I have my baby boy circumcised?  There is no easy answer. Circumcision has some benefits. But it also has risks. After talking with your doctor, you will have to decide for yourself what is right for your family.  What are the benefits of circumcision?  Circumcised boys seem to have slightly lower rates of: ?Urinary tract infections ?Swelling of the opening at the tip of the penis Circumcised men seem to have slightly lower rates of: ?Urinary tract infections ?Swelling of the opening at the tip of the penis ?Penis  cancer ?HIV and other infections that you catch during sex ?Cervical cancer in the women they have sex with Even so, in the [place], the risks of these problems are small - even in boys and men who have not been circumcised. Plus, boys and men who are not circumcised can reduce these extra risks by: ?Cleaning their penis well ?Using condoms during sex  What are the risks of circumcision?  Risks include: ?Bleeding or infection from the surgery ?Damage to or amputation of the penis ?A chance that the doctor will cut off too much or not enough of the foreskin ?A chance that sex won't feel as good later in life Only about 1 out of every 200 circumcisions leads to problems. There is also a chance that your health insurance won't pay for circumcision.  How is circumcision done in baby boys?  First, the baby gets medicine for pain relief. This might be a cream on the skin or a shot into the base of the penis. Next, the doctor cleans the baby's penis well. Then he or she uses special tools to cut off the foreskin. Finally, the doctor wraps a bandage (called gauze) around the baby's penis. If you have your baby circumcised, his doctor or nurse will give you instructions on how to care for him after the surgery. It is important that you follow those instructions carefully.  Fetal Movement Counts Patient Name: ________________________________________________ Patient Due Date: ____________________ What is a fetal movement count? A fetal movement count is the number of times that you feel your baby move during a certain amount of time. This may also be called a fetal kick count. A fetal movement count is recommended for every pregnant woman. You may be  asked to start counting fetal movements as early as week 28 of your pregnancy. Pay attention to when your baby is most active. You may notice your baby's sleep and wake cycles. You may also notice things that make your baby move more. You should do a  fetal movement count:  When your baby is normally most active.  At the same time each day.  A good time to count movements is while you are resting, after having something to eat and drink. How do I count fetal movements? 1. Find a quiet, comfortable area. Sit, or lie down on your side. 2. Write down the date, the start time and stop time, and the number of movements that you felt between those two times. Take this information with you to your health care visits. 3. For 2 hours, count kicks, flutters, swishes, rolls, and jabs. You should feel at least 10 movements during 2 hours. 4. You may stop counting after you have felt 10 movements. 5. If you do not feel 10 movements in 2 hours, have something to eat and drink. Then, keep resting and counting for 1 hour. If you feel at least 4 movements during that hour, you may stop counting. Contact a health care provider if:  You feel fewer than 4 movements in 2 hours.  Your baby is not moving like he or she usually does. Date: ____________ Start time: ____________ Stop time: ____________ Movements: ____________ Date: ____________ Start time: ____________ Stop time: ____________ Movements: ____________ Date: ____________ Start time: ____________ Stop time: ____________ Movements: ____________ Date: ____________ Start time: ____________ Stop time: ____________ Movements: ____________ Date: ____________ Start time: ____________ Stop time: ____________ Movements: ____________ Date: ____________ Start time: ____________ Stop time: ____________ Movements: ____________ Date: ____________ Start time: ____________ Stop time: ____________ Movements: ____________ Date: ____________ Start time: ____________ Stop time: ____________ Movements: ____________ Date: ____________ Start time: ____________ Stop time: ____________ Movements: ____________ This information is not intended to replace advice given to you by your health care provider. Make sure you discuss  any questions you have with your health care provider. Document Released: 07/05/2006 Document Revised: 02/02/2016 Document Reviewed: 07/15/2015 Elsevier Interactive Patient Education  2018 [employer].  [employer] of the uterus can occur throughout pregnancy, but they are not always a sign that you are in labor. You may have practice contractions called Braxton Hicks contractions. These false labor contractions are sometimes confused with true labor. What are Deberah Pelton contractions? Braxton Hicks contractions are tightening movements that occur in the muscles of the uterus before labor. Unlike true labor contractions, these contractions do not result in opening (dilation) and thinning of the cervix. Toward the end of pregnancy (32-34 weeks), Braxton Hicks contractions can happen more often and may become stronger. These contractions are sometimes difficult to tell apart from true labor because they can be very uncomfortable. You should not feel embarrassed if you go to the hospital with false labor. Sometimes, the only way to tell if you are in true labor is for your health care provider to look for changes in the cervix. The health care provider will do a physical exam and may monitor your contractions. If you are not in true labor, the exam should show that your cervix is not dilating and your water has not broken. If there are other health problems associated with your pregnancy, it is completely safe for you to be sent home with false labor. You may continue to have Braxton Hicks contractions until you go into  true labor. How to tell the difference between true labor and false labor True labor  Contractions last 30-70 seconds.  Contractions become very regular.  Discomfort is usually felt in the top of the uterus, and it spreads to the lower abdomen and low back.  Contractions do not go away with walking.  Contractions usually become more intense and  increase in frequency.  The cervix dilates and gets thinner. False labor  Contractions are usually shorter and not as strong as true labor contractions.  Contractions are usually irregular.  Contractions are often felt in the front of the lower abdomen and in the groin.  Contractions may go away when you walk around or change positions while lying down.  Contractions get weaker and are shorter-lasting as time goes on.  The cervix usually does not dilate or become thin. Follow these instructions at home:  Take over-the-counter and prescription medicines only as told by your health care provider.  Keep up with your usual exercises and follow other instructions from your health care provider.  Eat and drink lightly if you think you are going into labor.  If Braxton Hicks contractions are making you uncomfortable: ? Change your position from lying down or resting to walking, or change from walking to resting. ? Sit and rest in a tub of warm water. ? Drink enough fluid to keep your urine pale yellow. Dehydration may cause these contractions. ? Do slow and deep breathing several times an hour.  Keep all follow-up prenatal visits as told by your health care provider. This is important. Contact a health care provider if:  You have a fever.  You have continuous pain in your abdomen. Get help right away if:  Your contractions become stronger, more regular, and closer together.  You have fluid leaking or gushing from your vagina.  You pass blood-tinged mucus (bloody show).  You have bleeding from your vagina.  You have low back pain that you never had before.  You feel your baby's head pushing down and causing pelvic pressure.  Your baby is not moving inside you as much as it used to. Summary  Contractions that occur before labor are called Braxton Hicks contractions, false labor, or practice contractions.  Braxton Hicks contractions are usually shorter, weaker, farther  apart, and less regular than true labor contractions. True labor contractions usually become progressively stronger and regular and they become more frequent.  Manage discomfort from [facility] contractions by changing position, resting in a warm bath, drinking plenty of water, or practicing deep breathing. This information is not intended to replace advice given to you by your health care provider. Make sure you discuss any questions you have with your health care provider. Document Released: 10/19/2016 Document Revised: 10/19/2016 Document Reviewed: 10/19/2016 Elsevier Interactive Patient Education  2018 [employer]

## 2017-07-25 ENCOUNTER — Encounter: Payer: Self-pay | Admitting: Family Medicine

## 2017-07-25 ENCOUNTER — Ambulatory Visit (INDEPENDENT_AMBULATORY_CARE_PROVIDER_SITE_OTHER): Payer: Self-pay | Admitting: Student

## 2017-07-25 ENCOUNTER — Encounter: Payer: Self-pay | Admitting: *Deleted

## 2017-07-25 VITALS — BP 97/62 | HR 86 | Wt 161.7 lb

## 2017-07-25 DIAGNOSIS — F329 Major depressive disorder, single episode, unspecified: Secondary | ICD-10-CM

## 2017-07-25 DIAGNOSIS — Z3480 Encounter for supervision of other normal pregnancy, unspecified trimester: Secondary | ICD-10-CM

## 2017-07-25 DIAGNOSIS — F32A Depression, unspecified: Secondary | ICD-10-CM

## 2017-07-25 NOTE — Patient Instructions (Signed)
Vaginal Birth After Cesarean Delivery Vaginal birth after cesarean delivery (VBAC) is giving birth vaginally after previously delivering a baby by a cesarean. In the past, if a woman had a cesarean delivery, all births afterward would be done by cesarean delivery. This is no longer true. It can be safe for the mother to try a vaginal delivery after having a cesarean delivery. It is important to discuss VBAC with your health care provider early in the pregnancy so you can understand the risks, benefits, and options. It will give you time to decide what is best in your particular case. The final decision about whether to have a VBAC or repeat cesarean delivery should be between you and your health care provider. Any changes in your health or your baby's health during your pregnancy may make it necessary to change your initial decision about VBAC. Women who plan to have a VBAC should check with their health care provider to be sure that:  The previous cesarean delivery was done with a low transverse uterine cut (incision) (not a vertical classical incision).  The birth canal is big enough for the baby.  There were no other operations on the uterus.  An electronic fetal monitor (EFM) will be on at all times during labor.  An operating room will be available and ready in case an emergency cesarean delivery is needed.  A health care provider and surgical nursing staff will be available at all times during labor to be ready to do an emergency delivery cesarean if necessary.  An anesthesiologist will be present in case an emergency cesarean delivery is needed.  The nursery is prepared and has adequate personnel and necessary equipment available to care for the baby in case of an emergency cesarean delivery. Benefits of VBAC  Shorter stay in the hospital.  Avoidance of risks associated with cesarean delivery, such as: ? Surgical complications, such as opening of the incision or hernia in the  incision. ? Injury to other organs. ? Fever. This can occur if an infection develops after surgery. It can also occur as a reaction to the medicine given to make you numb during the surgery.  Less blood loss and need for blood transfusions.  Lower risk of blood clots and infection.  Shorter recovery.  Decreased risk for having to remove the uterus (hysterectomy).  Decreased risk for the placenta to completely or partially cover the opening of the uterus (placenta previa) with a future pregnancy.  Decrease risk in future labor and delivery. Risks of a VBAC  Tearing (rupture) of the uterus. This is occurs in less than 1% of VBACs. The risk of this happening is higher if: ? Steps are taken to begin the labor process (induce labor) or stimulate or strengthen contractions (augment labor). ? Medicine is used to soften (ripen) the cervix.  Having to remove the uterus (hysterectomy) if it ruptures. VBAC should not be done if:  The previous cesarean delivery was done with a vertical (classical) or T-shaped incision or you do not know what kind of incision was made.  You had a ruptured uterus.  You have had certain types of surgery on your uterus, such as removal of uterine fibroids. Ask your health care provider about other types of surgeries that prevent you from having a VBAC.  You have certain medical or childbirth (obstetrical) problems.  There are problems with the baby.  You have had two previous cesarean deliveries and no vaginal deliveries. Other facts to know about VBAC:  It   is safe to have an epidural anesthetic with VBAC.  It is safe to turn the baby from a breech position (attempt an external cephalic version).  It is safe to try a VBAC with twins.  VBAC may not be successful if your baby weights 8.8 lb (4 kg) or more. However, weight predictions are not always accurate and should not be used alone to decide if VBAC is right for you.  There is an increased failure rate  if the time between the cesarean delivery and VBAC is less than 19 months.  Your health care provider may advise against a VBAC if you have preeclampsia (high blood pressure, protein in the urine, and swelling of face and extremities).  VBAC is often successful if you previously gave birth vaginally.  VBAC is often successful when the labor starts spontaneously before the due date.  Delivering a baby through a VBAC is similar to having a normal spontaneous vaginal delivery. This information is not intended to replace advice given to you by your health care provider. Make sure you discuss any questions you have with your health care provider. Document Released: 11/26/2006 Document Revised: 11/11/2015 Document Reviewed: 01/02/2013 Elsevier Interactive Patient Education  2018 Elsevier Inc.  

## 2017-07-25 NOTE — Progress Notes (Signed)
   PRENATAL VISIT NOTE  Subjective:  Dana Mcconnell is a 27 y.o. G3P2002 at 4164w0d being seen today for ongoing prenatal care.  She is currently monitored for the following issues for this low-risk pregnancy and has Supervision of other normal pregnancy, antepartum; Sickle cell trait (HCC); Previous cesarean section; Asthma; ASCUS with positive high risk HPV cervical; Marijuana use; and Depression on their problem list.  Patient reports teary about argument with baby's dad today. He was supposed to come to the visit and then refused to come. She does not want to see Asher MuirJamie today but will see her at the next visit. .  Contractions: Irregular. Vag. Bleeding: None.  Movement: Present. Denies leaking of fluid.   The following portions of the patient's history were reviewed and updated as appropriate: allergies, current medications, past family history, past medical history, past social history, past surgical history and problem list. Problem list updated.  Objective:   Vitals:   07/25/17 0853  BP: 97/62  Pulse: 86  Weight: 161 lb 11.2 oz (73.3 kg)    Fetal Status: Fetal Heart Rate (bpm): 140 Fundal Height: 34 cm Movement: Present     General:  Alert, oriented and cooperative. Patient is in no acute distress.  Skin: Skin is warm and dry. No rash noted.   Cardiovascular: Normal heart rate noted  Respiratory: Normal respiratory effort, no problems with respiration noted  Abdomen: Soft, gravid, appropriate for gestational age.  Pain/Pressure: Present     Pelvic: Cervical exam deferred        Extremities: Normal range of motion.  Edema: Trace  Mental Status:  Normal mood and affect. Normal behavior. Normal judgment and thought content.   Assessment and Plan:  Pregnancy: G3P2002 at 5964w0d  1. Depression, unspecified depression type Offered supprt and encouragement to her; patient knows to call if she feels like she's feeling worse.  - Ambulatory referral to [facility]  2.  Supervision of other normal pregnancy, antepartum Still planning VBAC.   Preterm labor symptoms and general obstetric precautions including but not limited to vaginal bleeding, contractions, leaking of fluid and fetal movement were reviewed in detail with the patient. Please refer to After Visit Summary for other counseling recommendations.  Return in about 2 weeks (around 08/08/2017), or [facility] and patient to see jamie at next visit.   Marylene LandKathryn Lorraine Gwin Eagon, CNM

## 2017-08-05 ENCOUNTER — Encounter (HOSPITAL_COMMUNITY): Payer: Self-pay

## 2017-08-05 ENCOUNTER — Inpatient Hospital Stay (HOSPITAL_COMMUNITY)
Admission: AD | Admit: 2017-08-05 | Discharge: 2017-08-05 | Disposition: A | Discharge status: Home / Self Care | Payer: Medicaid Other | Source: Ambulatory Visit | Attending: Family Medicine | Admitting: Family Medicine

## 2017-08-05 DIAGNOSIS — Z87891 Personal history of nicotine dependence: Secondary | ICD-10-CM | POA: Diagnosis not present

## 2017-08-05 DIAGNOSIS — J45909 Unspecified asthma, uncomplicated: Secondary | ICD-10-CM | POA: Diagnosis not present

## 2017-08-05 DIAGNOSIS — Z3689 Encounter for other specified antenatal screening: Secondary | ICD-10-CM

## 2017-08-05 DIAGNOSIS — Z3A33 33 weeks gestation of pregnancy: Secondary | ICD-10-CM | POA: Diagnosis not present

## 2017-08-05 DIAGNOSIS — O4703 False labor before 37 completed weeks of gestation, third trimester: Secondary | ICD-10-CM

## 2017-08-05 DIAGNOSIS — R109 Unspecified abdominal pain: Secondary | ICD-10-CM | POA: Diagnosis present

## 2017-08-05 DIAGNOSIS — Z3480 Encounter for supervision of other normal pregnancy, unspecified trimester: Secondary | ICD-10-CM

## 2017-08-05 DIAGNOSIS — O99013 Anemia complicating pregnancy, third trimester: Secondary | ICD-10-CM | POA: Insufficient documentation

## 2017-08-05 DIAGNOSIS — O34219 Maternal care for unspecified type scar from previous cesarean delivery: Secondary | ICD-10-CM | POA: Insufficient documentation

## 2017-08-05 DIAGNOSIS — D573 Sickle-cell trait: Secondary | ICD-10-CM | POA: Insufficient documentation

## 2017-08-05 DIAGNOSIS — O99513 Diseases of the respiratory system complicating pregnancy, third trimester: Secondary | ICD-10-CM | POA: Diagnosis not present

## 2017-08-05 LAB — URINALYSIS, ROUTINE W REFLEX MICROSCOPIC
Bilirubin Urine: NEGATIVE
GLUCOSE, UA: NEGATIVE mg/dL
Hgb urine dipstick: NEGATIVE
KETONES UR: 20 mg/dL — AB
Nitrite: NEGATIVE
Protein, ur: NEGATIVE mg/dL
Specific Gravity, Urine: 1.008 (ref 1.005–1.030)
pH: 8 (ref 5.0–8.0)

## 2017-08-05 LAB — FETAL FIBRONECTIN: FETAL FIBRONECTIN: NEGATIVE

## 2017-08-05 MED ORDER — NIFEDIPINE 10 MG PO CAPS
10.0000 mg | ORAL_CAPSULE | Freq: Once | ORAL | Status: AC
  Administered 2017-08-05: 10 mg via ORAL
  Filled 2017-08-05: qty 1

## 2017-08-05 MED ORDER — LACTATED RINGERS IV BOLUS (SEPSIS)
1000.0000 mL | Freq: Once | INTRAVENOUS | Status: AC
  Administered 2017-08-05: 1000 mL via INTRAVENOUS

## 2017-08-05 MED ORDER — TERBUTALINE SULFATE 1 MG/ML IJ SOLN
0.2500 mg | Freq: Once | INTRAMUSCULAR | Status: AC
  Administered 2017-08-05: 0.25 mg via SUBCUTANEOUS
  Filled 2017-08-05: qty 1

## 2017-08-05 NOTE — MAU Note (Signed)
Pt could leave urine sample

## 2017-08-05 NOTE — MAU Provider Note (Signed)
History     CSN: [id number]  Arrival date and time: 08/05/17 0865   First Provider Initiated Contact with Patient 08/05/17 1929      Chief Complaint  Patient presents with  . Contractions   HPI Dana Mcconnell is a 27 y.o. G3P2002 at [redacted]w[redacted]d who presents with contractions. Reports feeling abdominal pain since church this morning. Reports feeling sharp pain that come & go. States they are coming back to back. Rates pain 8/10. Has not treated pain. Denies n/v/d, dysuria, vaginal bleeding, or LOF. No recent intercourse. Positive fetal movement. No hx of preterm labor or delivery. Denies recent fall or abdominal trauma.  OB History    Gravida Para Term Preterm AB Living   3 2 2  0 0 2   SAB TAB Ectopic Multiple Live Births   0 0 0 0 2      Past Medical History:  Diagnosis Date  . Asthma    inhaler prn  . Chlamydia   . Sickle cell trait [facility])     Past Surgical History:  Procedure Laterality Date  . CESAREAN SECTION N/A 05/02/2013   Procedure: Primary Ceasrean Section Delivery Baby Boy @ 0135, Apgars 7/9;  Surgeon: Adam Phenix, MD;  Location: [facility];  Service: Obstetrics;  Laterality: N/A;  . HERNIA REPAIR    . KNEE DISLOCATION SURGERY  2009   left  . TONSILLECTOMY    . UMBILICAL HERNIA REPAIR     > 10 yrs ago     Family History  Problem Relation Age of Onset  . Asthma Maternal Grandmother   . Diabetes Maternal Grandfather   . Anesthesia problems Neg Hx   . Hypotension Neg Hx   . Malignant hyperthermia Neg Hx   . Pseudochol deficiency Neg Hx   . Alcohol abuse Neg Hx   . Arthritis Neg Hx   . Birth defects Neg Hx   . COPD Neg Hx   . Cancer Neg Hx   . Depression Neg Hx   . Drug abuse Neg Hx   . Early death Neg Hx   . Hearing loss Neg Hx   . Heart disease Neg Hx   . Hyperlipidemia Neg Hx   . Hypertension Neg Hx   . Kidney disease Neg Hx   . Learning disabilities Neg Hx   . Mental illness Neg Hx   . Mental retardation Neg Hx   . Miscarriages / Stillbirths Neg  Hx   . Stroke Neg Hx   . Vision loss Neg Hx     Social History   Tobacco Use  . Smoking status: Former Smoker    Last attempt to quit: 08/21/2010    Years since quitting: 6.9  . Smokeless tobacco: Never Used  Substance Use Topics  . Alcohol use: No  . Drug use: No    Comment: 6 months ago    Allergies: No Known Allergies  Medications Prior to Admission  Medication Sig Dispense Refill Last Dose  . albuterol (PROVENTIL HFA;VENTOLIN HFA) 108 (90 Base) MCG/ACT inhaler Inhale 2 puffs into the lungs every 6 (six) hours as needed for wheezing or shortness of breath. 1 Inhaler 0 Past Week at Unknown time  . butalbital-acetaminophen-caffeine (FIORICET, ESGIC) 50-325-40 MG tablet Take 1-2 tablets by mouth every 6 (six) hours as needed for headache. Do not take more than 6 tablets a day. 20 tablet 0 Past Month at Unknown time  . Prenatal Multivit-Min-Fe-FA (PRENATAL VITAMINS) 0.8 MG tablet Take 1 tablet by mouth daily.  30 tablet 12 08/04/2017 at Unknown time  . ferrous sulfate 325 (65 FE) MG tablet Take 1 tablet (325 mg total) by mouth daily with breakfast. (Patient not taking: Reported on 07/25/2017) 30 tablet 3 Not Taking  . Prenatal Vit-Fe Fumarate-FA (PREPLUS) 27-1 MG TABS   1 Not Taking    Review of Systems  Constitutional: Negative.   Gastrointestinal: Positive for abdominal pain.  Genitourinary: Negative.    Physical Exam   Blood pressure 103/64, pulse (!) 102, temperature 98.5 F (36.9 C), temperature source Oral, resp. rate 18, height 5\' 5"  (1.651 m), weight 162 lb (73.5 kg), last menstrual period 12/06/2016, SpO2 100 %, currently breastfeeding.  Physical Exam  Nursing note and vitals reviewed. Constitutional: She is oriented to person, place, and time. She appears well-developed and well-nourished. No distress.  HENT:  Head: Normocephalic and atraumatic.  Eyes: Conjunctivae are normal. Right eye exhibits no discharge. Left eye exhibits no discharge. No scleral icterus.  Neck:  Normal range of motion.  Cardiovascular: Normal rate, regular rhythm and normal heart sounds.  No murmur heard. Respiratory: Effort normal and breath sounds normal. No respiratory distress. She has no wheezes.  GI: Soft. There is no tenderness.  Abdomen soft. Ctx palpate mild. Adequate resting tone.   Neurological: She is alert and oriented to person, place, and time.  Skin: Skin is warm and dry. She is not diaphoretic.  Psychiatric: She has a normal mood and affect. Her behavior is normal. Judgment and thought content normal.    MAU Course  Procedures Results for orders placed or performed during the hospital encounter of 08/05/17 (from the past 24 hour(s))  Fetal fibronectin     Status: None   Collection Time: 08/05/17  7:33 PM  Result Value Ref Range   Fetal Fibronectin NEGATIVE NEGATIVE  Urinalysis, Routine w reflex microscopic     Status: Abnormal   Collection Time: 08/05/17  7:37 PM  Result Value Ref Range   Color, Urine YELLOW YELLOW   APPearance HAZY (A) CLEAR   Specific Gravity, Urine 1.008 1.005 - 1.030   pH 8.0 5.0 - 8.0   Glucose, UA NEGATIVE NEGATIVE mg/dL   Hgb urine dipstick NEGATIVE NEGATIVE   Bilirubin Urine NEGATIVE NEGATIVE   Ketones, ur 20 (A) NEGATIVE mg/dL   Protein, ur NEGATIVE NEGATIVE mg/dL   Nitrite NEGATIVE NEGATIVE   Leukocytes, UA SMALL (A) NEGATIVE   RBC / HPF 0-5 0 - 5 RBC/hpf   WBC, UA 6-30 0 - 5 WBC/hpf   Bacteria, UA RARE (A) NONE SEEN   Squamous Epithelial / LPF 6-30 (A) NONE SEEN    MDM NST:  Baseline: 135 bpm, Variability: Good {> 6 bpm), Accelerations: Reactive and Decelerations: Absent  Cervix 1.5/thick. FFN negative.  IV fluid bolus & procardia 10 mg. Unable to give additional doses of procardia d/t low BP. Patient reports pain has not improved. Terbutaline 0.25 mg ordered.   [facility] CNM Donette LarryBhambri, Shameer [place], [place] 08/05/2017 9:36 PM   Pt reports improvement of ctx. Cervix unchanged. Stable for discharge  home.  Assessment and Plan   1. [redacted] weeks gestation of pregnancy   2. Supervision of other normal pregnancy, antepartum   3. NST (non-stress test) reactive   4. Preterm uterine contractions, antepartum, third trimester    Discharge home Follow up in OB office this week as scheduled OOW tomorrow Hydrate Rest PTL precautions  Allergies as of 08/05/2017   No Known Allergies     Medication List  TAKE these medications   albuterol 108 (90 Base) MCG/ACT inhaler Commonly known as:  PROVENTIL HFA;VENTOLIN HFA Inhale 2 puffs into the lungs every 6 (six) hours as needed for wheezing or shortness of breath.   butalbital-acetaminophen-caffeine 50-325-40 MG tablet Commonly known as:  FIORICET, ESGIC Take 1-2 tablets by mouth every 6 (six) hours as needed for headache. Do not take more than 6 tablets a day.   ferrous sulfate 325 (65 FE) MG tablet Take 1 tablet (325 mg total) by mouth daily with breakfast.   Prenatal Vitamins 0.8 MG tablet Take 1 tablet by mouth daily.   PREPLUS 27-1 MG Tabs      Donette Larry, [place]  08/05/2017 9:37 PM

## 2017-08-05 NOTE — MAU Note (Signed)
Pt here with c/o contractions for the past 2 hours. Denies any bleeding or leaking of fluid. Reports good fetal movement.

## 2017-08-05 NOTE — Discharge Instructions (Signed)
Braxton Hicks Contractions °Contractions of the uterus can occur throughout pregnancy, but they are not always a sign that you are in labor. You may have practice contractions called Braxton Hicks contractions. These false labor contractions are sometimes confused with true labor. °What are Braxton Hicks contractions? °Braxton Hicks contractions are tightening movements that occur in the muscles of the uterus before labor. Unlike true labor contractions, these contractions do not result in opening (dilation) and thinning of the cervix. Toward the end of pregnancy (32-34 weeks), Braxton Hicks contractions can happen more often and may become stronger. These contractions are sometimes difficult to tell apart from true labor because they can be very uncomfortable. You should not feel embarrassed if you go to the hospital with false labor. °Sometimes, the only way to tell if you are in true labor is for your health care provider to look for changes in the cervix. The health care provider will do a physical exam and may monitor your contractions. If you are not in true labor, the exam should show that your cervix is not dilating and your water has not broken. °If there are other health problems associated with your pregnancy, it is completely safe for you to be sent home with false labor. You may continue to have Braxton Hicks contractions until you go into true labor. °How to tell the difference between true labor and false labor °True labor °· Contractions last 30-70 seconds. °· Contractions become very regular. °· Discomfort is usually felt in the top of the uterus, and it spreads to the lower abdomen and low back. °· Contractions do not go away with walking. °· Contractions usually become more intense and increase in frequency. °· The cervix dilates and gets thinner. °False labor °· Contractions are usually shorter and not as strong as true labor contractions. °· Contractions are usually irregular. °· Contractions  are often felt in the front of the lower abdomen and in the groin. °· Contractions may go away when you walk around or change positions while lying down. °· Contractions get weaker and are shorter-lasting as time goes on. °· The cervix usually does not dilate or become thin. °Follow these instructions at home: °· Take over-the-counter and prescription medicines only as told by your health care provider. °· Keep up with your usual exercises and follow other instructions from your health care provider. °· Eat and drink lightly if you think you are going into labor. °· If Braxton Hicks contractions are making you uncomfortable: °? Change your position from lying down or resting to walking, or change from walking to resting. °? Sit and rest in a tub of warm water. °? Drink enough fluid to keep your urine pale yellow. Dehydration may cause these contractions. °? Do slow and deep breathing several times an hour. °· Keep all follow-up prenatal visits as told by your health care provider. This is important. °Contact a health care provider if: °· You have a fever. °· You have continuous pain in your abdomen. °Get help right away if: °· Your contractions become stronger, more regular, and closer together. °· You have fluid leaking or gushing from your vagina. °· You pass blood-tinged mucus (bloody show). °· You have bleeding from your vagina. °· You have low back pain that you never had before. °· You feel your baby’s head pushing down and causing pelvic pressure. °· Your baby is not moving inside you as much as it used to. °Summary °· Contractions that occur before labor are called Braxton   Hicks contractions, false labor, or practice contractions. °· Braxton Hicks contractions are usually shorter, weaker, farther apart, and less regular than true labor contractions. True labor contractions usually become progressively stronger and regular and they become more frequent. °· Manage discomfort from Braxton Hicks contractions by  changing position, resting in a warm bath, drinking plenty of water, or practicing deep breathing. °This information is not intended to replace advice given to you by your health care provider. Make sure you discuss any questions you have with your health care provider. °Document Released: 10/19/2016 Document Revised: 10/19/2016 Document Reviewed: 10/19/2016 °Elsevier Interactive Patient Education © 2018 Elsevier Inc. ° °

## 2017-08-05 NOTE — MAU Note (Signed)
Pt says she is still feeling the ctx, but they feel like they're spacing out and not as strong.

## 2017-08-09 ENCOUNTER — Encounter: Payer: Self-pay | Admitting: Student

## 2017-08-09 ENCOUNTER — Ambulatory Visit (INDEPENDENT_AMBULATORY_CARE_PROVIDER_SITE_OTHER): Payer: Self-pay | Admitting: Student

## 2017-08-09 VITALS — BP 108/52 | HR 89 | Wt 160.9 lb

## 2017-08-09 DIAGNOSIS — Z3480 Encounter for supervision of other normal pregnancy, unspecified trimester: Secondary | ICD-10-CM

## 2017-08-09 NOTE — Patient Instructions (Signed)
Preventing Unintended Injuries, Adult Unintended injuries are accidents that result in harm. They are very common and can happen almost anywhere. Most occur at home or in the car. Examples include car accidents, drownings, machinery accidents, drug overdoses, falls, and fires. Unintended injuries can be serious. Most unintended injuries are preventable. Taking some simple steps in your daily life can reduce your risk of unintended injury. What lifestyle changes can be made?  Look for tripping hazards around your home, such as loose rugs and cords where people walk. Use no-slip mats and install grab bars in the bathroom.  Make sure you do not plug too many things into an outlet at one time.  Be careful when using ladders or power tools at home and work. Put these items away as soon as you are done using them.  When driving or riding in a car, always wear your seat belt. Obey the speed limit and traffic laws.  Do not use power tools or drive when you are sleepy or impaired by alcohol or drugs.  Do not use drugs or alcohol while doing activities that require a lot of energy or concentration. Limit the amount of alcohol you drink before or during recreational activities.  Use a handrail when walking up and down stairs.  Consider using night lights.  Store Patent attorneykitchen knives in a safe place. Use them carefully. What child-proofing changes can be made? Take these steps to make your house safer for children:  Keep medicines and cleaning products secured and out of the reach of children.  Put plug covers on all electrical outlets.  Install window guards on upper levels.  Keep small items out of the reach of young children. A child could choke on anything that is small enough to fit inside a toilet paper tube.  Practice kitchen safety. Handles of pots and pans should always be turned inward when cooking on the stove.  Why are these changes important? These changes can:  Reduce your chance of  having an accident.  Reduce likelihood of injury or the severity of injury from any accidents you have.  How else can I protect myself?  Take swimming lessons to prevent water-related accidents.  Wear appropriate safety gear when riding a bicycle or participating in sports.  Install smoke detectors and carbon monoxide detectors in your home. Check the batteries yearly.  Make sure that you and everyone in your home knows basic first aid, including what to do when someone is choking.  Make sure that your workplace is safe and follows guidelines from the Occupational Safety and Health Administration Teaching laboratory technician(OSHA). Where to find more information:  U.[place]. Department of Health and Human Services: https://duncan.com/https://www.hhs.gov/programs/prevention-and-wellness/healthy-lifestyle/index.html  Office of Disease Prevention and Health Promotion: PrestigeBlog.uywww.healthypeople.gov/2020/topics-objectives/topic/injury-and-violence-prevention  Centers for Disease Control and Prevention: OpinionSwap.eswww.cdc.gov/injury/index.html Summary  Most unintended injuries are preventable.  Making simple lifestyle changes, such as changing your home environment and your driving habits, can prevent many unintended injuries.  Take precautions at work and during recreational activities to avoid unintended injuries. This information is not intended to replace advice given to you by your health care provider. Make sure you discuss any questions you have with your health care provider. Document Released: 02/20/2016 Document Revised: 02/20/2016 Document Reviewed: 02/20/2016 Elsevier Interactive Patient Education  2018 [employer]

## 2017-08-09 NOTE — Progress Notes (Signed)
   PRENATAL VISIT NOTE  Subjective:  Dana Mcconnell is a 27 y.o. G3P2002 at 6942w1d being seen today for ongoing prenatal care.  She is currently monitored for the following issues for this low-risk pregnancy and has Supervision of other normal pregnancy, antepartum; Sickle cell trait (HCC); Previous cesarean section; Asthma; ASCUS with positive high risk HPV cervical; Marijuana use; Depression; and Preterm labor on their problem list.  Patient reports occasional contractions.  Nothing like what she had on Sunday with contractions 1-2 minutes apart. She endorses occasional vaginal discharge but not continuous leaking of fluid. Denies bleeding today. Does not want her cervix checked today. Did not receive betamethasone in the MAU.   Contractions: Irregular.  .  Movement: Present. Denies leaking of fluid.   The following portions of the patient's history were reviewed and updated as appropriate: allergies, current medications, past family history, past medical history, past social history, past surgical history and problem list. Problem list updated.  Objective:   Vitals:   08/09/17 1319  BP: (!) 108/52  Pulse: 89  Weight: 160 lb 14.4 oz (73 kg)    Fetal Status: Fetal Heart Rate (bpm): 131 Fundal Height: 34 cm Movement: Present     General:  Alert, oriented and cooperative. Patient is in no acute distress.  Skin: Skin is warm and dry. No rash noted.   Cardiovascular: Normal heart rate noted  Respiratory: Normal respiratory effort, no problems with respiration noted  Abdomen: Soft, gravid, appropriate for gestational age.  Pain/Pressure: Present     Pelvic: Cervical exam deferred        Extremities: Normal range of motion.  Edema: Trace  Mental Status:  Normal mood and affect. Normal behavior. Normal judgment and thought content.   Assessment and Plan:  Pregnancy: G3P2002 at 6742w1d  1. Supervision of other normal pregnancy, antepartum -Work note given for lifting restrictions.    -Encouraged patient to keep eating small bits of high protein food and snacks.   2. Preterm labor in second trimester without delivery Reviewed signs and symptoms of pre-term labor; will not RX for procardia today as patient's BP too low. Reviewed warning signs and when to return to MAU.   Preterm labor symptoms and general obstetric precautions including but not limited to vaginal bleeding, contractions, leaking of fluid and fetal movement were reviewed in detail with the patient. Please refer to After Visit Summary for other counseling recommendations.  Return 2 weeks  for LROB.   Marylene LandKathryn Lorraine Kooistra, CNM

## 2017-08-17 ENCOUNTER — Encounter: Payer: Self-pay | Admitting: Obstetrics and Gynecology

## 2017-08-23 ENCOUNTER — Other Ambulatory Visit (HOSPITAL_COMMUNITY)
Admission: RE | Admit: 2017-08-23 | Discharge: 2017-08-23 | Disposition: A | Discharge status: Home / Self Care | Payer: Medicaid Other | Source: Ambulatory Visit | Attending: Student | Admitting: Student

## 2017-08-23 ENCOUNTER — Encounter: Payer: Self-pay | Admitting: Family Medicine

## 2017-08-23 ENCOUNTER — Ambulatory Visit (INDEPENDENT_AMBULATORY_CARE_PROVIDER_SITE_OTHER): Payer: Self-pay | Admitting: Student

## 2017-08-23 VITALS — BP 108/65 | HR 89 | Wt 161.6 lb

## 2017-08-23 DIAGNOSIS — Z3480 Encounter for supervision of other normal pregnancy, unspecified trimester: Secondary | ICD-10-CM

## 2017-08-23 DIAGNOSIS — Z3A00 Weeks of gestation of pregnancy not specified: Secondary | ICD-10-CM | POA: Insufficient documentation

## 2017-08-23 DIAGNOSIS — O99013 Anemia complicating pregnancy, third trimester: Secondary | ICD-10-CM

## 2017-08-23 DIAGNOSIS — Z98891 History of uterine scar from previous surgery: Secondary | ICD-10-CM

## 2017-08-23 LAB — OB RESULTS CONSOLE GC/CHLAMYDIA: GC PROBE AMP, GENITAL: NEGATIVE

## 2017-08-23 LAB — OB RESULTS CONSOLE GBS: GBS: NEGATIVE

## 2017-08-23 MED ORDER — PRENATAL VITAMINS 0.8 MG PO TABS: 1.0000 | ORAL_TABLET | Freq: Every day | ORAL | 12 refills | Status: DC

## 2017-08-23 MED ORDER — FERROUS SULFATE 325 (65 FE) MG PO TABS: 325.0000 mg | ORAL_TABLET | Freq: Every day | ORAL | 3 refills | Status: DC

## 2017-08-23 NOTE — Progress Notes (Signed)
   PRENATAL VISIT NOTE  Subjective:  Dana Mcconnell is a 27 y.o. G3P2002 at 584w1d being seen today for ongoing prenatal care.  She is currently monitored for the following issues for this low-risk pregnancy and has Supervision of other normal pregnancy, antepartum; Sickle cell trait (HCC); Previous cesarean section; Asthma; ASCUS with positive high risk HPV cervical; Marijuana use; Depression; and Preterm labor on their problem list.  Patient reports no complaints.  She is not feeling strong contractions ; denies discharge or vaginal bleeding.  Contractions: Irritability. Vag. Bleeding: None.  Movement: Present. Denies leaking of fluid.   The following portions of the patient's history were reviewed and updated as appropriate: allergies, current medications, past family history, past medical history, past social history, past surgical history and problem list. Problem list updated.  Objective:   Vitals:   08/23/17 1312  BP: 108/65  Pulse: 89  Weight: 161 lb 9.6 oz (73.3 kg)    Fetal Status: Fetal Heart Rate (bpm): 136 Fundal Height: 36 cm Movement: Present  Presentation: Vertex  General:  Alert, oriented and cooperative. Patient is in no acute distress.  Skin: Skin is warm and dry. No rash noted.   Cardiovascular: Normal heart rate noted  Respiratory: Normal respiratory effort, no problems with respiration noted  Abdomen: Soft, gravid, appropriate for gestational age.  Pain/Pressure: Present     Pelvic: Cervical exam deferred Dilation: 1.5   Station: Ballotable  Extremities: Normal range of motion.  Edema: Trace  Mental Status:  Normal mood and affect. Normal behavior. Normal judgment and thought content.   Assessment and Plan:  Pregnancy: G3P2002 at 684w1d  1. Supervision of other normal pregnancy, antepartum  - GC[facility]Chlamydia [facility] amp [facility] - Culture, beta strep (group b only) - CBC - Prenatal Multivit-Min-Fe-FA (PRENATAL VITAMINS) 0.8 MG tablet; Take 1  tablet by mouth daily.  Dispense: 30 tablet; Refill: 12  2. Preterm labor in second trimester without delivery -Contractions have subsided  3. Anemia in pregnancy, third trimester Patient admits she is not taking iron; agrees to start taking iron today. Reviewed importance of having iron stores before she delivers.  - ferrous sulfate 325 (65 FE) MG tablet; Take 1 tablet (325 mg total) by mouth daily with breakfast.  Dispense: 30 tablet; Refill: 3  4. Previous cesarean section -Still desires TOLAC.   Preterm labor symptoms and general obstetric precautions including but not limited to vaginal bleeding, contractions, leaking of fluid and fetal movement were reviewed in detail with the patient. Please refer to After Visit Summary for other counseling recommendations.  Return in about 1 week (around 08/30/2017).   Dana Mcconnell, CNM

## 2017-08-23 NOTE — Patient Instructions (Signed)

## 2017-08-24 LAB — CBC
Hematocrit: 27.1 % — ABNORMAL LOW (ref 34.0–46.6)
Hemoglobin: 9.2 g/dL — ABNORMAL LOW (ref 11.1–15.9)
MCH: 31.6 pg (ref 26.6–33.0)
MCHC: 33.9 g/dL (ref 31.5–35.7)
MCV: 93 fL (ref 79–97)
Platelets: 197 10*3/uL (ref 150–379)
RBC: 2.91 x10E6/uL — ABNORMAL LOW (ref 3.77–5.28)
RDW: 13 % (ref 12.3–15.4)
WBC: 6.1 10*3/uL (ref 3.4–10.8)

## 2017-08-25 LAB — GC/CHLAMYDIA PROBE AMP (~~LOC~~) NOT AT ARMC
Chlamydia: NEGATIVE
Neisseria Gonorrhea: NEGATIVE

## 2017-08-27 LAB — CULTURE, BETA STREP (GROUP B ONLY): Strep Gp B Culture: NEGATIVE

## 2017-08-30 ENCOUNTER — Ambulatory Visit (INDEPENDENT_AMBULATORY_CARE_PROVIDER_SITE_OTHER): Payer: Self-pay | Admitting: Student

## 2017-08-30 DIAGNOSIS — Z3480 Encounter for supervision of other normal pregnancy, unspecified trimester: Secondary | ICD-10-CM

## 2017-08-30 DIAGNOSIS — O99013 Anemia complicating pregnancy, third trimester: Secondary | ICD-10-CM

## 2017-08-30 NOTE — Progress Notes (Signed)
   PRENATAL VISIT NOTE  Subjective:  Dana Mcconnell is a 27 y.o. G3P2002 at 7854w1d being seen today for ongoing prenatal care.  She is currently monitored for the following issues for this low-risk pregnancy and has Supervision of other normal pregnancy, antepartum; Sickle cell trait (HCC); Previous cesarean section; Asthma; ASCUS with positive high risk HPV cervical; Marijuana use; Depression; Preterm labor; and Anemia affecting pregnancy in third trimester on their problem list.  Patient reports occasional contractions and fatigue. Has not had to visit MAU for contractions since February.  No dizziness, SOB, feelings of passing out.  Contractions: Irritability. Vag. Bleeding: None.  Movement: Present. Denies leaking of fluid.   The following portions of the patient's history were reviewed and updated as appropriate: allergies, current medications, past family history, past medical history, past social history, past surgical history and problem list. Problem list updated.  Objective:   Vitals:   08/30/17 1313  BP: 100/61  Pulse: 85  Weight: 165 lb 11.2 oz (75.2 kg)    Fetal Status: Fetal Heart Rate (bpm): 131 Fundal Height: 37 cm Movement: Present     General:  Alert, oriented and cooperative. Patient is in no acute distress.  Skin: Skin is warm and dry. No rash noted.   Cardiovascular: Normal heart rate noted  Respiratory: Normal respiratory effort, no problems with respiration noted  Abdomen: Soft, gravid, appropriate for gestational age.  Pain/Pressure: Present     Pelvic: Cervical exam deferred        Extremities: Normal range of motion.  Edema: Trace  Mental Status:  Normal mood and affect. Normal behavior. Normal judgment and thought content.   Assessment and Plan:  Pregnancy: G3P2002 at 9054w1d  1. Supervision of other normal pregnancy, antepartum -Doing well besides fatigue and occasional contractions.   2. Anemia affecting pregnancy in third trimester -Encouraged her to  take her iron pills at least once a day, explained importance of iron stores during delivery and PP, as well as to help with her fatigue.   3. Preterm labor in second trimester without delivery Patient doing well; contractions are infrequent. Associated with standing or at the end of the day.   Term labor symptoms and general obstetric precautions including but not limited to vaginal bleeding, contractions, leaking of fluid and fetal movement were reviewed in detail with the patient. Please refer to After Visit Summary for other counseling recommendations.  Return in about 1 week (around 09/06/2017).   Dana Mcconnell, CNM

## 2017-08-30 NOTE — Patient Instructions (Signed)

## 2017-09-04 ENCOUNTER — Inpatient Hospital Stay (HOSPITAL_COMMUNITY)
Admission: AD | Admit: 2017-09-04 | Discharge: 2017-09-04 | Disposition: A | Discharge status: Home / Self Care | Payer: Medicaid Other | Source: Ambulatory Visit | Attending: Family Medicine | Admitting: Family Medicine

## 2017-09-04 ENCOUNTER — Encounter (HOSPITAL_COMMUNITY): Payer: Self-pay

## 2017-09-04 DIAGNOSIS — Z029 Encounter for administrative examinations, unspecified: Secondary | ICD-10-CM | POA: Diagnosis present

## 2017-09-04 DIAGNOSIS — Z3480 Encounter for supervision of other normal pregnancy, unspecified trimester: Secondary | ICD-10-CM

## 2017-09-04 DIAGNOSIS — O479 False labor, unspecified: Secondary | ICD-10-CM

## 2017-09-04 NOTE — Discharge Instructions (Signed)
Braxton Hicks Contractions °Contractions of the uterus can occur throughout pregnancy, but they are not always a sign that you are in labor. You may have practice contractions called Braxton Hicks contractions. These false labor contractions are sometimes confused with true labor. °What are Braxton Hicks contractions? °Braxton Hicks contractions are tightening movements that occur in the muscles of the uterus before labor. Unlike true labor contractions, these contractions do not result in opening (dilation) and thinning of the cervix. Toward the end of pregnancy (32-34 weeks), Braxton Hicks contractions can happen more often and may become stronger. These contractions are sometimes difficult to tell apart from true labor because they can be very uncomfortable. You should not feel embarrassed if you go to the hospital with false labor. °Sometimes, the only way to tell if you are in true labor is for your health care provider to look for changes in the cervix. The health care provider will do a physical exam and may monitor your contractions. If you are not in true labor, the exam should show that your cervix is not dilating and your water has not broken. °If there are other health problems associated with your pregnancy, it is completely safe for you to be sent home with false labor. You may continue to have Braxton Hicks contractions until you go into true labor. °How to tell the difference between true labor and false labor °True labor °· Contractions last 30-70 seconds. °· Contractions become very regular. °· Discomfort is usually felt in the top of the uterus, and it spreads to the lower abdomen and low back. °· Contractions do not go away with walking. °· Contractions usually become more intense and increase in frequency. °· The cervix dilates and gets thinner. °False labor °· Contractions are usually shorter and not as strong as true labor contractions. °· Contractions are usually irregular. °· Contractions  are often felt in the front of the lower abdomen and in the groin. °· Contractions may go away when you walk around or change positions while lying down. °· Contractions get weaker and are shorter-lasting as time goes on. °· The cervix usually does not dilate or become thin. °Follow these instructions at home: °· Take over-the-counter and prescription medicines only as told by your health care provider. °· Keep up with your usual exercises and follow other instructions from your health care provider. °· Eat and drink lightly if you think you are going into labor. °· If Braxton Hicks contractions are making you uncomfortable: °? Change your position from lying down or resting to walking, or change from walking to resting. °? Sit and rest in a tub of warm water. °? Drink enough fluid to keep your urine pale yellow. Dehydration may cause these contractions. °? Do slow and deep breathing several times an hour. °· Keep all follow-up prenatal visits as told by your health care provider. This is important. °Contact a health care provider if: °· You have a fever. °· You have continuous pain in your abdomen. °Get help right away if: °· Your contractions become stronger, more regular, and closer together. °· You have fluid leaking or gushing from your vagina. °· You pass blood-tinged mucus (bloody show). °· You have bleeding from your vagina. °· You have low back pain that you never had before. °· You feel your baby’s head pushing down and causing pelvic pressure. °· Your baby is not moving inside you as much as it used to. °Summary °· Contractions that occur before labor are called Braxton   Hicks contractions, false labor, or practice contractions. °· Braxton Hicks contractions are usually shorter, weaker, farther apart, and less regular than true labor contractions. True labor contractions usually become progressively stronger and regular and they become more frequent. °· Manage discomfort from Braxton Hicks contractions by  changing position, resting in a warm bath, drinking plenty of water, or practicing deep breathing. °This information is not intended to replace advice given to you by your health care provider. Make sure you discuss any questions you have with your health care provider. °Document Released: 10/19/2016 Document Revised: 10/19/2016 Document Reviewed: 10/19/2016 °Elsevier Interactive Patient Education © 2018 Elsevier Inc. ° °

## 2017-09-04 NOTE — MAU Note (Signed)
I have communicated with Dr Bennetta LaosPhelpsand reviewed vital signs:  Vitals:   09/04/17 2042 09/04/17 2146  BP: (!) 95/56 101/63  Pulse: 84   Resp: 18   Temp: 98.8 F (37.1 C)     Vaginal exam:  Dilation: 1 Effacement (%): Thick Cervical Position: Posterior Station: -3 Presentation: Vertex Exam by:: Lafonda Mossesiana      RN ,   Also reviewed contraction pattern and that non-stress test is reactive.  It has been documented that patient is contracting every 3-7 minutes with nocervical change since last checked in office not indicating active labor.  Patient denies any other complaints.  Based on this report provider has given order for discharge.  A discharge order and diagnosis entered by a provider.   Labor discharge instructions reviewed with patient.

## 2017-09-04 NOTE — MAU Note (Signed)
Cntrx began around 1600. Now q5. Denies LOF or bleeding. +FM

## 2017-09-06 ENCOUNTER — Ambulatory Visit (INDEPENDENT_AMBULATORY_CARE_PROVIDER_SITE_OTHER): Payer: Medicaid Other | Admitting: Student

## 2017-09-06 ENCOUNTER — Encounter: Payer: Self-pay | Admitting: Student

## 2017-09-06 VITALS — BP 106/66 | HR 70 | Wt 161.7 lb

## 2017-09-06 DIAGNOSIS — J4520 Mild intermittent asthma, uncomplicated: Secondary | ICD-10-CM

## 2017-09-06 DIAGNOSIS — O99013 Anemia complicating pregnancy, third trimester: Secondary | ICD-10-CM

## 2017-09-06 DIAGNOSIS — Z3480 Encounter for supervision of other normal pregnancy, unspecified trimester: Secondary | ICD-10-CM

## 2017-09-06 NOTE — Progress Notes (Signed)
   PRENATAL VISIT NOTE  Subjective:  Dana Mcconnell is a 27 y.o. G3P2002 at 8237w1d being seen today for ongoing prenatal care.  She is currently monitored for the following issues for this low-risk pregnancy and has Supervision of other normal pregnancy, antepartum; Sickle cell trait (HCC); Previous cesarean section; Asthma; ASCUS with positive high risk HPV cervical; Marijuana use; Depression; Preterm labor; and Anemia affecting pregnancy in third trimester on their problem list.  Patient reports occasional contractions. She hasn't been to work and she was in the MAU on Tuesday night. She was determined not to be in labor and sent home. .  Contractions: Irritability. Vag. Bleeding: None.  Movement: Present. Denies leaking of fluid.   The following portions of the patient's history were reviewed and updated as appropriate: allergies, current medications, past family history, past medical history, past social history, past surgical history and problem list. Problem list updated.  Objective:   Vitals:   09/06/17 1309  BP: 106/66  Pulse: 70  Weight: 161 lb 11.2 oz (73.3 kg)    Fetal Status: Fetal Heart Rate (bpm): 121   Movement: Present     General:  Alert, oriented and cooperative. Patient is in no acute distress.  Skin: Skin is warm and dry. No rash noted.   Cardiovascular: Normal heart rate noted  Respiratory: Normal respiratory effort, no problems with respiration noted  Abdomen: Soft, gravid, appropriate for gestational age.  Pain/Pressure: Present     Pelvic: Cervical exam deferred        Extremities: Normal range of motion.  Edema: Trace  Mental Status:  Normal mood and affect. Normal behavior. Normal judgment and thought content.   Assessment and Plan:  Pregnancy: G3P2002 at 2137w1d  1. Mild intermittent asthma without complication Hasn't had to use inhaler since last month, before than it was several months.   2. Supervision of other normal pregnancy, antepartum -Still with  contractions; reviewed when to come to MAU.   3. Anemia affecting pregnancy in third trimester Now remembering to take her iron 3 times a week.   Term labor symptoms and general obstetric precautions including but not limited to vaginal bleeding, contractions, leaking of fluid and fetal movement were reviewed in detail with the patient. Please refer to After Visit Summary for other counseling recommendations.  Return in about 1 week (around 09/13/2017).   Marylene LandKathryn Lorraine Tiffanyann Deroo, CNM

## 2017-09-06 NOTE — Patient Instructions (Addendum)
Vaginal Delivery Vaginal delivery means that you will give birth by pushing your baby out of your birth canal (vagina). A team of health care providers will help you before, during, and after vaginal delivery. Birth experiences are unique for every woman and every pregnancy, and birth experiences vary depending on where you choose to give birth. What should I do to prepare for my baby's birth? Before your baby is born, it is important to talk with your health care provider about:  Your labor and delivery preferences. These may include: ? Medicines that you may be given. ? How you will manage your pain. This might include non-medical pain relief techniques or injectable pain relief such as epidural analgesia. ? How you and your baby will be monitored during labor and delivery. ? Who may be in the labor and delivery room with you. ? Your feelings about surgical delivery of your baby (cesarean delivery, or C-section) if this becomes necessary. ? Your feelings about receiving donated blood through an IV tube (blood transfusion) if this becomes necessary.  Whether you are able: ? To take pictures or videos of the birth. ? To eat during labor and delivery. ? To move around, walk, or change positions during labor and delivery.  What to expect after your baby is born, such as: ? Whether delayed umbilical cord clamping and cutting is offered. ? Who will care for your baby right after birth. ? Medicines or tests that may be recommended for your baby. ? Whether breastfeeding is supported in your hospital or birth center. ? How long you will be in the hospital or birth center.  How any medical conditions you have may affect your baby or your labor and delivery experience.  To prepare for your baby's birth, you should also:  Attend all of your health care visits before delivery (prenatal visits) as recommended by your health care provider. This is important.  Prepare your home for your baby's  arrival. Make sure that you have: ? Diapers. ? Baby clothing. ? Feeding equipment. ? Safe sleeping arrangements for you and your baby.  Install a car seat in your vehicle. Have your car seat checked by a certified car seat installer to make sure that it is installed safely.  Think about who will help you with your new baby at home for at least the first several weeks after delivery.  What can I expect when I arrive at the birth center or hospital? Once you are in labor and have been admitted into the hospital or birth center, your health care provider may:  Review your pregnancy history and any concerns you have.  Insert an IV tube into one of your veins. This is used to give you fluids and medicines.  Check your blood pressure, pulse, temperature, and heart rate (vital signs).  Check whether your bag of water (amniotic sac) has broken (ruptured).  Talk with you about your birth plan and discuss pain control options.  Monitoring Your health care provider may monitor your contractions (uterine monitoring) and your baby's heart rate (fetal monitoring). You may need to be monitored:  Often, but not continuously (intermittently).  All the time or for long periods at a time (continuously). Continuous monitoring may be needed if: ? You are taking certain medicines, such as medicine to relieve pain or make your contractions stronger. ? You have pregnancy or labor complications.  Monitoring may be done by:  Placing a special stethoscope or a handheld monitoring device on your abdomen to   check your baby's heartbeat, and feeling your abdomen for contractions. This method of monitoring does not continuously record your baby's heartbeat or your contractions.  Placing monitors on your abdomen (external monitors) to record your baby's heartbeat and the frequency and length of contractions. You may not have to wear external monitors all the time.  Placing monitors inside of your uterus  (internal monitors) to record your baby's heartbeat and the frequency, length, and strength of your contractions. ? Your health care provider may use internal monitors if he or she needs more information about the strength of your contractions or your baby's heart rate. ? Internal monitors are put in place by passing a thin, flexible wire through your vagina and into your uterus. Depending on the type of monitor, it may remain in your uterus or on your baby's head until birth. ? Your health care provider will discuss the benefits and risks of internal monitoring with you and will ask for your permission before inserting the monitors.  Telemetry. This is a type of continuous monitoring that can be done with external or internal monitors. Instead of having to stay in bed, you are able to move around during telemetry. Ask your health care provider if telemetry is an option for you.  Physical exam Your health care provider may perform a physical exam. This may include:  Checking whether your baby is positioned: ? With the head toward your vagina (head-down). This is most common. ? With the head toward the top of your uterus (head-up or breech). If your baby is in a breech position, your health care provider may try to turn your baby to a head-down position so you can deliver vaginally. If it does not seem that your baby can be born vaginally, your provider may recommend surgery to deliver your baby. In rare cases, you may be able to deliver vaginally if your baby is head-up (breech delivery). ? Lying sideways (transverse). Babies that are lying sideways cannot be delivered vaginally.  Checking your cervix to determine: ? Whether it is thinning out (effacing). ? Whether it is opening up (dilating). ? How low your baby has moved into your birth canal.  What are the three stages of labor and delivery?  Normal labor and delivery is divided into the following three stages: Stage 1  Stage 1 is the  longest stage of labor, and it can last for hours or days. Stage 1 includes: ? Early labor. This is when contractions may be irregular, or regular and mild. Generally, early labor contractions are more than 10 minutes apart. ? Active labor. This is when contractions get longer, more regular, more frequent, and more intense. ? The transition phase. This is when contractions happen very close together, are very intense, and may last longer than during any other part of labor.  Contractions generally feel mild, infrequent, and irregular at first. They get stronger, more frequent (about every 2-3 minutes), and more regular as you progress from early labor through active labor and transition.  Many women progress through stage 1 naturally, but you may need help to continue making progress. If this happens, your health care provider may talk with you about: ? Rupturing your amniotic sac if it has not ruptured yet. ? Giving you medicine to help make your contractions stronger and more frequent.  Stage 1 ends when your cervix is completely dilated to 4 inches (10 cm) and completely effaced. This happens at the end of the transition phase. Stage 2  Once   your cervix is completely effaced and dilated to 4 inches (10 cm), you may start to feel an urge to push. It is common for the body to naturally take a rest before feeling the urge to push, especially if you received an epidural or certain other pain medicines. This rest period may last for up to 1-2 hours, depending on your unique labor experience.  During stage 2, contractions are generally less painful, because pushing helps relieve contraction pain. Instead of contraction pain, you may feel stretching and burning pain, especially when the widest part of your baby's head passes through the vaginal opening (crowning).  Your health care provider will closely monitor your pushing progress and your baby's progress through the vagina during stage 2.  Your  health care provider may massage the area of skin between your vaginal opening and anus (perineum) or apply warm compresses to your perineum. This helps it stretch as the baby's head starts to crown, which can help prevent perineal tearing. ? In some cases, an incision may be made in your perineum (episiotomy) to allow the baby to pass through the vaginal opening. An episiotomy helps to make the opening of the vagina larger to allow more room for the baby to fit through.  It is very important to breathe and focus so your health care provider can control the delivery of your baby's head. Your health care provider may have you decrease the intensity of your pushing, to help prevent perineal tearing.  After delivery of your baby's head, the shoulders and the rest of the body generally deliver very quickly and without difficulty.  Once your baby is delivered, the umbilical cord may be cut right away, or this may be delayed for 1-2 minutes, depending on your baby's health. This may vary among health care providers, hospitals, and birth centers.  If you and your baby are healthy enough, your baby may be placed on your chest or abdomen to help maintain the baby's temperature and to help you bond with each other. Some mothers and babies start breastfeeding at this time. Your health care team will dry your baby and help keep your baby warm during this time.  Your baby may need immediate care if he or she: ? Showed signs of distress during labor. ? Has a medical condition. ? Was born too early (prematurely). ? Had a bowel movement before birth (meconium). ? Shows signs of difficulty transitioning from being inside the uterus to being outside of the uterus. If you are planning to breastfeed, your health care team will help you begin a feeding. Stage 3  The third stage of labor starts immediately after the birth of your baby and ends after you deliver the placenta. The placenta is an organ that develops  during pregnancy to provide oxygen and nutrients to your baby in the womb.  Delivering the placenta may require some pushing, and you may have mild contractions. Breastfeeding can stimulate contractions to help you deliver the placenta.  After the placenta is delivered, your uterus should tighten (contract) and become firm. This helps to stop bleeding in your uterus. To help your uterus contract and to control bleeding, your health care provider may: ? Give you medicine by injection, through an IV tube, by mouth, or through your rectum (rectally). ? Massage your abdomen or perform a vaginal exam to remove any blood clots that are left in your uterus. ? Empty your bladder by placing a thin, flexible tube (catheter) into your bladder. ? Encourage   you to breastfeed your baby. After labor is over, you and your baby will be monitored closely to ensure that you are both healthy until you are ready to go home. Your health care team will teach you how to care for yourself and your baby. This information is not intended to replace advice given to you by your health care provider. Make sure you discuss any questions you have with your health care provider. Document Released: 03/14/2008 Document Revised: 12/24/2015 Document Reviewed: 06/20/2015 Elsevier Interactive Patient Education  2018 [employer]  

## 2017-09-10 ENCOUNTER — Encounter (HOSPITAL_COMMUNITY): Payer: Self-pay | Admitting: *Deleted

## 2017-09-10 ENCOUNTER — Inpatient Hospital Stay (HOSPITAL_COMMUNITY): Payer: Medicaid Other | Admitting: Anesthesiology

## 2017-09-10 ENCOUNTER — Encounter (HOSPITAL_COMMUNITY)
Admission: AD | Disposition: A | Discharge status: Home / Self Care | Payer: Self-pay | Source: Ambulatory Visit | Attending: Obstetrics and Gynecology

## 2017-09-10 ENCOUNTER — Inpatient Hospital Stay (HOSPITAL_COMMUNITY)
Admission: AD | Admit: 2017-09-10 | Discharge: 2017-09-11 | DRG: 807 | Disposition: A | Discharge status: Home / Self Care | Payer: Medicaid Other | Source: Ambulatory Visit | Attending: Obstetrics and Gynecology | Admitting: Obstetrics and Gynecology

## 2017-09-10 DIAGNOSIS — O99344 Other mental disorders complicating childbirth: Secondary | ICD-10-CM | POA: Diagnosis present

## 2017-09-10 DIAGNOSIS — F329 Major depressive disorder, single episode, unspecified: Secondary | ICD-10-CM | POA: Diagnosis present

## 2017-09-10 DIAGNOSIS — O9952 Diseases of the respiratory system complicating childbirth: Secondary | ICD-10-CM | POA: Diagnosis present

## 2017-09-10 DIAGNOSIS — D573 Sickle-cell trait: Secondary | ICD-10-CM | POA: Diagnosis present

## 2017-09-10 DIAGNOSIS — J45909 Unspecified asthma, uncomplicated: Secondary | ICD-10-CM | POA: Diagnosis present

## 2017-09-10 DIAGNOSIS — F32A Depression, unspecified: Secondary | ICD-10-CM | POA: Diagnosis present

## 2017-09-10 DIAGNOSIS — O34219 Maternal care for unspecified type scar from previous cesarean delivery: Secondary | ICD-10-CM

## 2017-09-10 DIAGNOSIS — Z87891 Personal history of nicotine dependence: Secondary | ICD-10-CM | POA: Diagnosis not present

## 2017-09-10 DIAGNOSIS — Z8759 Personal history of other complications of pregnancy, childbirth and the puerperium: Secondary | ICD-10-CM

## 2017-09-10 DIAGNOSIS — O9902 Anemia complicating childbirth: Secondary | ICD-10-CM | POA: Diagnosis present

## 2017-09-10 DIAGNOSIS — Z3A38 38 weeks gestation of pregnancy: Secondary | ICD-10-CM

## 2017-09-10 DIAGNOSIS — Z3490 Encounter for supervision of normal pregnancy, unspecified, unspecified trimester: Secondary | ICD-10-CM

## 2017-09-10 DIAGNOSIS — Z3480 Encounter for supervision of other normal pregnancy, unspecified trimester: Secondary | ICD-10-CM

## 2017-09-10 DIAGNOSIS — Z3483 Encounter for supervision of other normal pregnancy, third trimester: Secondary | ICD-10-CM | POA: Diagnosis present

## 2017-09-10 LAB — CBC
HCT: 29.3 % — ABNORMAL LOW (ref 36.0–46.0)
Hemoglobin: 10.2 g/dL — ABNORMAL LOW (ref 12.0–15.0)
MCH: 31.5 pg (ref 26.0–34.0)
MCHC: 34.8 g/dL (ref 30.0–36.0)
MCV: 90.4 fL (ref 78.0–100.0)
PLATELETS: 192 10*3/uL (ref 150–400)
RBC: 3.24 MIL/uL — ABNORMAL LOW (ref 3.87–5.11)
RDW: 11.8 % (ref 11.5–15.5)
WBC: 5.8 10*3/uL (ref 4.0–10.5)

## 2017-09-10 LAB — TYPE AND SCREEN
ABO/RH(D): O POS
Antibody Screen: NEGATIVE

## 2017-09-10 LAB — RPR: RPR Ser Ql: NONREACTIVE

## 2017-09-10 LAB — ABO/RH: ABO/RH(D): O POS

## 2017-09-10 SURGERY — Surgical Case
Anesthesia: Epidural | Site: Vagina | Wound class: Clean Contaminated

## 2017-09-10 SURGERY — LIGATION, FALLOPIAN TUBE, POSTPARTUM
Anesthesia: Epidural | Laterality: Bilateral

## 2017-09-10 MED ORDER — FENTANYL CITRATE (PF) 100 MCG/2ML IJ SOLN
100.0000 ug | INTRAMUSCULAR | Status: DC | PRN
  Administered 2017-09-10: 100 ug via INTRAVENOUS

## 2017-09-10 MED ORDER — OXYTOCIN 40 UNITS IN LACTATED RINGERS INFUSION - SIMPLE MED
INTRAVENOUS | Status: AC
  Administered 2017-09-10: 500 mL via INTRAVENOUS
  Filled 2017-09-10: qty 1000

## 2017-09-10 MED ORDER — IBUPROFEN 600 MG PO TABS
600.0000 mg | ORAL_TABLET | Freq: Four times a day (QID) | ORAL | Status: DC
  Administered 2017-09-10 – 2017-09-11 (×4): 600 mg via ORAL
  Filled 2017-09-10 (×4): qty 1

## 2017-09-10 MED ORDER — EPHEDRINE 5 MG/ML INJ: 10.0000 mg | INTRAVENOUS | Status: DC | PRN

## 2017-09-10 MED ORDER — CEFAZOLIN SODIUM-DEXTROSE 2-4 GM/100ML-% IV SOLN
2.0000 g | Freq: Once | INTRAVENOUS | Status: AC
  Administered 2017-09-10: 2 g via INTRAVENOUS

## 2017-09-10 MED ORDER — OXYCODONE HCL 5 MG PO TABS: 5.0000 mg | ORAL_TABLET | ORAL | Status: DC | PRN

## 2017-09-10 MED ORDER — DIPHENHYDRAMINE HCL 25 MG PO CAPS: 25.0000 mg | ORAL_CAPSULE | Freq: Four times a day (QID) | ORAL | Status: DC | PRN

## 2017-09-10 MED ORDER — LACTATED RINGERS IV SOLN: 500.0000 mL | INTRAVENOUS | Status: DC | PRN

## 2017-09-10 MED ORDER — LIDOCAINE HCL (PF) 1 % IJ SOLN: 30.0000 mL | INTRAMUSCULAR | Status: DC | PRN

## 2017-09-10 MED ORDER — BENZOCAINE-MENTHOL 20-0.5 % EX AERO
1.0000 "application " | INHALATION_SPRAY | CUTANEOUS | Status: DC | PRN
  Filled 2017-09-10 (×2): qty 56

## 2017-09-10 MED ORDER — ONDANSETRON HCL 4 MG/2ML IJ SOLN: 4.0000 mg | Freq: Four times a day (QID) | INTRAMUSCULAR | Status: DC | PRN

## 2017-09-10 MED ORDER — SENNOSIDES-DOCUSATE SODIUM 8.6-50 MG PO TABS
2.0000 | ORAL_TABLET | ORAL | Status: DC
  Administered 2017-09-10: 2 via ORAL
  Filled 2017-09-10: qty 2

## 2017-09-10 MED ORDER — DIBUCAINE 1 % RE OINT: 1.0000 "application " | TOPICAL_OINTMENT | RECTAL | Status: DC | PRN

## 2017-09-10 MED ORDER — PHENYLEPHRINE 40 MCG/ML (10ML) SYRINGE FOR IV PUSH (FOR BLOOD PRESSURE SUPPORT)
80.0000 ug | PREFILLED_SYRINGE | INTRAVENOUS | Status: DC | PRN
  Filled 2017-09-10: qty 10

## 2017-09-10 MED ORDER — FENTANYL CITRATE (PF) 100 MCG/2ML IJ SOLN
100.0000 ug | Freq: Once | INTRAMUSCULAR | Status: AC
  Administered 2017-09-10: 100 ug via INTRAVENOUS

## 2017-09-10 MED ORDER — ACETAMINOPHEN 325 MG PO TABS: 650.0000 mg | ORAL_TABLET | ORAL | Status: DC | PRN

## 2017-09-10 MED ORDER — FENTANYL CITRATE (PF) 100 MCG/2ML IJ SOLN
INTRAMUSCULAR | Status: AC
  Administered 2017-09-10: 100 ug via INTRAVENOUS
  Filled 2017-09-10: qty 2

## 2017-09-10 MED ORDER — PHENYLEPHRINE 40 MCG/ML (10ML) SYRINGE FOR IV PUSH (FOR BLOOD PRESSURE SUPPORT)
80.0000 ug | PREFILLED_SYRINGE | INTRAVENOUS | Status: DC | PRN
  Administered 2017-09-10: 80 ug via INTRAVENOUS

## 2017-09-10 MED ORDER — OXYTOCIN BOLUS FROM INFUSION
500.0000 mL | Freq: Once | INTRAVENOUS | Status: AC
  Administered 2017-09-10: 500 mL via INTRAVENOUS

## 2017-09-10 MED ORDER — SIMETHICONE 80 MG PO CHEW: 80.0000 mg | CHEWABLE_TABLET | ORAL | Status: DC | PRN

## 2017-09-10 MED ORDER — LACTATED RINGERS IV SOLN: 500.0000 mL | Freq: Once | INTRAVENOUS | Status: DC

## 2017-09-10 MED ORDER — SODIUM BICARBONATE 8.4 % IV SOLN
INTRAVENOUS | Status: DC | PRN
  Administered 2017-09-10: 10 mL via EPIDURAL

## 2017-09-10 MED ORDER — ONDANSETRON HCL 4 MG PO TABS: 4.0000 mg | ORAL_TABLET | ORAL | Status: DC | PRN

## 2017-09-10 MED ORDER — LIDOCAINE HCL (PF) 1 % IJ SOLN
INTRAMUSCULAR | Status: DC | PRN
  Administered 2017-09-10 (×2): 4 mL via EPIDURAL

## 2017-09-10 MED ORDER — LIDOCAINE HCL (PF) 1 % IJ SOLN
INTRAMUSCULAR | Status: AC
  Filled 2017-09-10: qty 30

## 2017-09-10 MED ORDER — FENTANYL 2.5 MCG/ML BUPIVACAINE 1/10 % EPIDURAL INFUSION (WH - ANES)
14.0000 mL/h | INTRAMUSCULAR | Status: DC | PRN
  Administered 2017-09-10: 14 mL/h via EPIDURAL
  Filled 2017-09-10: qty 100

## 2017-09-10 MED ORDER — TERBUTALINE SULFATE 1 MG/ML IJ SOLN
INTRAMUSCULAR | Status: AC
  Administered 2017-09-10: 09:00:00
  Filled 2017-09-10: qty 1

## 2017-09-10 MED ORDER — ONDANSETRON HCL 4 MG/2ML IJ SOLN: 4.0000 mg | INTRAMUSCULAR | Status: DC | PRN

## 2017-09-10 MED ORDER — SOD CITRATE-CITRIC ACID 500-334 MG/5ML PO SOLN
30.0000 mL | Freq: Once | ORAL | Status: DC
  Filled 2017-09-10: qty 15

## 2017-09-10 MED ORDER — FENTANYL CITRATE (PF) 100 MCG/2ML IJ SOLN
INTRAMUSCULAR | Status: AC
  Filled 2017-09-10: qty 2

## 2017-09-10 MED ORDER — PRENATAL MULTIVITAMIN CH
1.0000 | ORAL_TABLET | Freq: Every day | ORAL | Status: DC
  Administered 2017-09-11: 1 via ORAL
  Filled 2017-09-10: qty 1

## 2017-09-10 MED ORDER — LACTATED RINGERS IV SOLN
INTRAVENOUS | Status: DC
  Administered 2017-09-10: 08:00:00 via INTRAVENOUS

## 2017-09-10 MED ORDER — DIPHENHYDRAMINE HCL 50 MG/ML IJ SOLN: 12.5000 mg | INTRAMUSCULAR | Status: DC | PRN

## 2017-09-10 MED ORDER — ZOLPIDEM TARTRATE 5 MG PO TABS: 5.0000 mg | ORAL_TABLET | Freq: Every evening | ORAL | Status: DC | PRN

## 2017-09-10 MED ORDER — SOD CITRATE-CITRIC ACID 500-334 MG/5ML PO SOLN: 30.0000 mL | ORAL | Status: DC | PRN

## 2017-09-10 MED ORDER — WITCH HAZEL-GLYCERIN EX PADS: 1.0000 "application " | MEDICATED_PAD | CUTANEOUS | Status: DC | PRN

## 2017-09-10 MED ORDER — OXYTOCIN 40 UNITS IN LACTATED RINGERS INFUSION - SIMPLE MED
2.5000 [IU]/h | INTRAVENOUS | Status: DC
  Administered 2017-09-10: 2.5 [IU]/h via INTRAVENOUS

## 2017-09-10 MED ORDER — TETANUS-DIPHTH-ACELL PERTUSSIS 5-2.5-18.5 LF-MCG/0.5 IM SUSP: 0.5000 mL | Freq: Once | INTRAMUSCULAR | Status: DC

## 2017-09-10 MED ORDER — COCONUT OIL OIL: 1.0000 "application " | TOPICAL_OIL | Status: DC | PRN

## 2017-09-10 SURGICAL SUPPLY — 39 items
BENZOIN TINCTURE PRP APPL 2/3 (GAUZE/BANDAGES/DRESSINGS) ×3 IMPLANT
CHLORAPREP W/TINT 26ML (MISCELLANEOUS) ×3 IMPLANT
CLAMP CORD UMBIL (MISCELLANEOUS) IMPLANT
CLOSURE WOUND 1/2 X4 (GAUZE/BANDAGES/DRESSINGS) ×1
CLOTH BEACON ORANGE TIMEOUT ST (SAFETY) ×3 IMPLANT
DRAPE C SECTION CLR SCREEN (DRAPES) IMPLANT
DRSG OPSITE POSTOP 4X10 (GAUZE/BANDAGES/DRESSINGS) ×3 IMPLANT
ELECT REM PT RETURN 9FT ADLT (ELECTROSURGICAL) ×3
ELECTRODE REM PT RTRN 9FT ADLT (ELECTROSURGICAL) ×1 IMPLANT
EXTRACTOR VACUUM M CUP 4 TUBE (SUCTIONS) IMPLANT
EXTRACTOR VACUUM M CUP 4' TUBE (SUCTIONS)
GLOVE BIO SURGEON STRL SZ7.5 (GLOVE) ×3 IMPLANT
GLOVE BIOGEL PI IND STRL 7.0 (GLOVE) ×1 IMPLANT
GLOVE BIOGEL PI INDICATOR 7.0 (GLOVE) ×2
GOWN STRL REUS W/TWL 2XL LVL3 (GOWN DISPOSABLE) ×3 IMPLANT
GOWN STRL REUS W/TWL LRG LVL3 (GOWN DISPOSABLE) ×6 IMPLANT
KIT ABG SYR 3ML LUER SLIP (SYRINGE) IMPLANT
NEEDLE HYPO 22GX1.5 SAFETY (NEEDLE) ×3 IMPLANT
NEEDLE HYPO 25X5/8 SAFETYGLIDE (NEEDLE) IMPLANT
NS IRRIG 1000ML POUR BTL (IV SOLUTION) ×3 IMPLANT
PACK C SECTION WH (CUSTOM PROCEDURE TRAY) ×3 IMPLANT
PAD OB MATERNITY 4.3X12.25 (PERSONAL CARE ITEMS) ×3 IMPLANT
PENCIL SMOKE EVAC W/HOLSTER (ELECTROSURGICAL) ×3 IMPLANT
RTRCTR C-SECT PINK 25CM LRG (MISCELLANEOUS) ×3 IMPLANT
STRIP CLOSURE SKIN 1/2X4 (GAUZE/BANDAGES/DRESSINGS) ×2 IMPLANT
SUT CHROMIC 1 CTX 36 (SUTURE) ×6 IMPLANT
SUT VIC AB 1 CT1 27 (SUTURE) ×4
SUT VIC AB 1 CT1 27XBRD ANTBC (SUTURE) ×2 IMPLANT
SUT VIC AB 2-0 CT1 (SUTURE) ×3 IMPLANT
SUT VIC AB 2-0 CT1 27 (SUTURE) ×2
SUT VIC AB 2-0 CT1 TAPERPNT 27 (SUTURE) ×1 IMPLANT
SUT VIC AB 3-0 CT1 27 (SUTURE) ×4
SUT VIC AB 3-0 CT1 TAPERPNT 27 (SUTURE) ×2 IMPLANT
SUT VIC AB 3-0 SH 27 (SUTURE)
SUT VIC AB 3-0 SH 27X BRD (SUTURE) IMPLANT
SUT VIC AB 4-0 KS 27 (SUTURE) ×3 IMPLANT
SYR BULB IRRIGATION 50ML (SYRINGE) IMPLANT
TOWEL OR 17X24 6PK STRL BLUE (TOWEL DISPOSABLE) ×3 IMPLANT
TRAY FOLEY BAG SILVER LF 14FR (SET/KITS/TRAYS/PACK) ×3 IMPLANT

## 2017-09-10 NOTE — Transfer of Care (Signed)
Immediate Anesthesia Transfer of Care Note  Patient: Dana Mcconnell  Procedure(s) Performed: VAGINAL DELIVERY IN OPERATING ROOM (N/A Vagina )  Patient Location: OR 1  Anesthesia Type:Epidural  Level of Consciousness: awake, alert  and oriented  Airway & Oxygen Therapy: Patient Spontanous Breathing  Post-op Assessment: Report given to RN and Post -op Vital signs reviewed and stable  Post vital signs: Reviewed and stable  Last Vitals:  Vitals Value Taken Time  BP 107/64 09/10/2017 12:34 PM  Temp    Pulse 80 09/10/2017 12:34 PM  Resp 20 09/10/2017 12:34 PM  SpO2      Last Pain:  Vitals:   09/10/17 1009  TempSrc: Oral  PainSc:          Complications: No apparent anesthesia complications

## 2017-09-10 NOTE — H&P (Addendum)
OBSTETRIC ADMISSION HISTORY AND PHYSICAL  Dana Mcconnell is a 27 y.o. female G3P2002 with IUP at [redacted]w[redacted]d by LMP presenting for SOL, desires TOLAC. She reports +FMs, No LOF, no VB, no blurry vision, headaches or peripheral edema, and RUQ pain.  She plans on bottle feeding. She request BTL for birth control (papers signed). She received her prenatal care at [facility]  Dating: By LMP --->  Estimated Date of Delivery: 09/19/17  Sono:   @[redacted]w[redacted]d , CWD, normal anatomy, cephalic presentation, 679g, 57% EFW  Prenatal History/Complications: Sickle cell trait Previous C/S for failure to descend Previous PTL Anemia during pregnancy (~9.0) ASCUS on PAP - needs postpartum work-up  Past Medical History: Past Medical History:  Diagnosis Date  . Asthma    inhaler prn  . Chlamydia   . Sickle cell trait [facility])    Past Surgical History: Past Surgical History:  Procedure Laterality Date  . CESAREAN SECTION N/A 05/02/2013   Procedure: Primary Ceasrean Section Delivery Baby Boy @ 0135, Apgars 7/9;  Surgeon: Adam Phenix, MD;  Location: [facility];  Service: Obstetrics;  Laterality: N/A;  . HERNIA REPAIR    . KNEE DISLOCATION SURGERY  2009   left  . TONSILLECTOMY    . UMBILICAL HERNIA REPAIR     > 10 yrs ago    Obstetrical History: OB History    Gravida  3   Para  2   Term  2   Preterm  0   AB  0   Living  2     SAB  0   TAB  0   Ectopic  0   Multiple  0   Live Births  2          Social History: Social History   Socioeconomic History  . Marital status: Single    Spouse name: Not on file  . Number of children: Not on file  . Years of education: Not on file  . Highest education level: Not on file  Occupational History  . Not on file  Social Needs  . Financial resource strain: Not on file  . Food insecurity:    Worry: Not on file    Inability: Not on file  . Transportation needs:    Medical: Not on file    Non-medical: Not on file  Tobacco Use  . Smoking status:  Former Smoker    Last attempt to quit: 08/21/2010    Years since quitting: 7.0  . Smokeless tobacco: Never Used  Substance and Sexual Activity  . Alcohol use: No  . Drug use: No    Types: Marijuana    Comment: 6 months ago  . Sexual activity: Yes  Lifestyle  . Physical activity:    Days per week: Not on file    Minutes per session: Not on file  . Stress: Not on file  Relationships  . Social connections:    Talks on phone: Not on file    Gets together: Not on file    Attends religious service: Not on file    Active member of club or organization: Not on file    Attends meetings of clubs or organizations: Not on file    Relationship status: Not on file  Other Topics Concern  . Not on file  Social History Narrative  . Not on file    Family History: Family History  Problem Relation Age of Onset  . Asthma Maternal Grandmother   . Diabetes Maternal Grandfather   .  Anesthesia problems Neg Hx   . Hypotension Neg Hx   . Malignant hyperthermia Neg Hx   . Pseudochol deficiency Neg Hx   . Alcohol abuse Neg Hx   . Arthritis Neg Hx   . Birth defects Neg Hx   . COPD Neg Hx   . Cancer Neg Hx   . Depression Neg Hx   . Drug abuse Neg Hx   . Early death Neg Hx   . Hearing loss Neg Hx   . Heart disease Neg Hx   . Hyperlipidemia Neg Hx   . Hypertension Neg Hx   . Kidney disease Neg Hx   . Learning disabilities Neg Hx   . Mental illness Neg Hx   . Mental retardation Neg Hx   . Miscarriages / Stillbirths Neg Hx   . Stroke Neg Hx   . Vision loss Neg Hx     Allergies: No Known Allergies  Medications Prior to Admission  Medication Sig Dispense Refill Last Dose  . albuterol (PROVENTIL HFA;VENTOLIN HFA) 108 (90 Base) MCG/ACT inhaler Inhale 2 puffs into the lungs every 6 (six) hours as needed for wheezing or shortness of breath. 1 Inhaler 0 Taking  . butalbital-acetaminophen-caffeine (FIORICET, ESGIC) 50-325-40 MG tablet Take 1-2 tablets by mouth every 6 (six) hours as needed for  headache. Do not take more than 6 tablets a day. 20 tablet 0 Taking  . ferrous sulfate 325 (65 FE) MG tablet Take 1 tablet (325 mg total) by mouth daily with breakfast. 30 tablet 3 Taking  . Prenatal Multivit-Min-Fe-FA (PRENATAL VITAMINS) 0.8 MG tablet Take 1 tablet by mouth daily. 30 tablet 12 Taking  . Prenatal Vit-Fe Fumarate-FA (PREPLUS) 27-1 MG TABS   1 Not Taking   Review of Systems   All systems reviewed and negative except as stated in HPI  Blood pressure 135/82, pulse 81, last menstrual period 12/06/2016, currently breastfeeding. General appearance: alert, cooperative and moderate distress Lungs: clear to auscultation bilaterally Heart: regular rate and rhythm Abdomen: soft, non-tender; bowel sounds normal Extremities: Homans sign is negative, no sign of DVT Presentation: cephalic Fetal monitoringBaseline: 120 bpm, Variability: Good {> 6 bpm), Accelerations: Reactive and Decelerations: Variable: mild-mod Uterine activity ctx every 2 min Dilation: 7.5 Effacement (%): 90 Exam by:: V.Rogers,CNM  Prenatal labs: ABO, Rh: O/Positive/-- (09/26 1425) Antibody: Positive, See Final Results (09/26 1425) Rubella: 1.68 (09/26 1425) RPR: Non Reactive (12/26 1212)  HBsAg: Negative (09/26 1425)  HIV: Non Reactive (12/26 1212)  GBS:  Negative 2 hr Glucola normal (72, 143, 112) Genetic screening  Negative (AFP) Anatomy US @ [redacted]w[redacted]d, normal anatomy.  Prenatal Transfer Tool  Maternal Diabetes: No Genetic Screening: Normal Maternal Ultrasounds/Referrals: Normal Fetal Ultrasounds or other Referrals:  None Maternal Substance Abuse:  No Significant Maternal Medications:  None Significant Maternal Lab Results: Lab values include: Group B Strep negative  No results found for this or any previous visit (from the past 24 hour(s)).  Patient Active Problem List   Diagnosis Date Noted  . Anemia affecting pregnancy in third trimester 08/30/2017  . Preterm labor 08/09/2017  . Marijuana use  05/14/2017  . Depression 05/14/2017  . Asthma 03/20/2017  . ASCUS with positive high risk HPV cervical 03/20/2017  . Supervision of other normal pregnancy, antepartum 03/14/2017  . Sickle cell trait (HCC) 03/14/2017  . Previous cesarean section 03/14/2017    Assessment/Plan:  Dana Mcconnell is a 27 y.o. G3P2002 at [redacted]w[redacted]d here for SOL, desires TOLAC.  #Labor: Expectant management. #Pain: Per patient request,  requesting Epidural #FWB: Cat II #ID: GBS neg #MOF: bottle #MOC: BTL  Ellwood DenseAlison Rumball, DO  09/10/2017, 7:44 AM   OB FELLOW HISTORY AND PHYSICAL ATTESTATION  I confirm that I have verified the information documented in the resident's note and that I have also personally reperformed the physical exam and all medical decision making activities. I agree with above documentation and have made edits as needed.   Caryl AdaJazma Aubriegh Minch, DO OB Fellow 09/10/2017, 11:39 AM

## 2017-09-10 NOTE — Anesthesia Postprocedure Evaluation (Signed)
Anesthesia Post Note  Patient: Dana BodilyJertica S Mcconnell  Procedure(s) Performed: VAGINAL DELIVERY IN OPERATING ROOM (N/A Vagina )     Patient location during evaluation: Mother Baby Anesthesia Type: Epidural Level of consciousness: awake Pain management: satisfactory to patient Vital Signs Assessment: post-procedure vital signs reviewed and stable Respiratory status: spontaneous breathing Cardiovascular status: stable Anesthetic complications: no    Last Vitals:  Vitals:   09/10/17 1430 09/10/17 1704  BP: 114/73 (!) 112/58  Pulse: 91 79  Resp: 18 18  Temp: 37 C 36.8 C    Last Pain:  Vitals:   09/10/17 1704  TempSrc: Oral  PainSc: 6    Pain Goal:                 KeyCorpBURGER,Tamla Winkels

## 2017-09-10 NOTE — MAU Note (Signed)
Pt started having ctx about 3am

## 2017-09-10 NOTE — Anesthesia Preprocedure Evaluation (Signed)
Anesthesia Evaluation  Patient identified by MRN, date of birth, ID band Patient awake    Reviewed: Allergy & Precautions, Patient's Chart, lab work & pertinent test results  Airway Mallampati: II  TM Distance: >3 FB Neck ROM: Full    Dental no notable dental hx.    Pulmonary asthma , former smoker,    Pulmonary exam normal breath sounds clear to auscultation       Cardiovascular negative cardio ROS Normal cardiovascular exam Rhythm:Regular Rate:Normal     Neuro/Psych PSYCHIATRIC DISORDERS Depression negative neurological ROS     GI/Hepatic negative GI ROS, Neg liver ROS,   Endo/Other  negative endocrine ROS  Renal/GU negative Renal ROS  negative genitourinary   Musculoskeletal   Abdominal   Peds  Hematology  (+) Sickle cell trait and anemia ,   Anesthesia Other Findings   Reproductive/Obstetrics (+) Pregnancy Previous C/Section                             Anesthesia Physical Anesthesia Plan  ASA: II  Anesthesia Plan: Epidural   Post-op Pain Management:    Induction:   PONV Risk Score and Plan:   Airway Management Planned: Natural Airway  Additional Equipment:   Intra-op Plan:   Post-operative Plan:   Informed Consent: I have reviewed the patients History and Physical, chart, labs and discussed the procedure including the risks, benefits and alternatives for the proposed anesthesia with the patient or authorized representative who has indicated his/her understanding and acceptance.     Plan Discussed with: Anesthesiologist  Anesthesia Plan Comments:         Anesthesia Quick Evaluation

## 2017-09-10 NOTE — Anesthesia Procedure Notes (Signed)
Epidural Patient location during procedure: OB Start time: 09/10/2017 8:30 AM  Staffing Anesthesiologist: Mal AmabileFoster, Kamea Dacosta, MD Performed: anesthesiologist   Preanesthetic Checklist Completed: patient identified, site marked, surgical consent, pre-op evaluation, timeout performed, IV checked, risks and benefits discussed and monitors and equipment checked  Epidural Patient position: sitting Prep: site prepped and draped and DuraPrep Patient monitoring: continuous pulse ox and blood pressure Approach: midline Location: L3-L4 Injection technique: LOR air  Needle:  Needle type: Tuohy  Needle gauge: 17 G Needle length: 9 cm and 9 Needle insertion depth: 4 cm Catheter type: closed end flexible Catheter size: 19 Gauge Catheter at skin depth: 9 cm Test dose: negative and Other  Assessment Events: blood not aspirated, injection not painful, no injection resistance, negative IV test and no paresthesia  Additional Notes Patient identified. Risks and benefits discussed including failed block, incomplete  Pain control, post dural puncture headache, nerve damage, paralysis, blood pressure Changes, nausea, vomiting, reactions to medications-both toxic and allergic and post Partum back pain. All questions were answered. Patient expressed understanding and wished to proceed. Sterile technique was used throughout procedure. Epidural site was Dressed with sterile barrier dressing. No paresthesias, signs of intravascular injection Or signs of intrathecal spread were encountered.  Patient was more comfortable after the epidural was dosed. Please see RN's note for documentation of vital signs and FHR which are stable.

## 2017-09-11 ENCOUNTER — Encounter (HOSPITAL_COMMUNITY): Payer: Self-pay

## 2017-09-11 ENCOUNTER — Encounter (HOSPITAL_COMMUNITY): Payer: Self-pay | Admitting: Obstetrics and Gynecology

## 2017-09-11 MED ORDER — IBUPROFEN 600 MG PO TABS: 600.0000 mg | ORAL_TABLET | Freq: Four times a day (QID) | ORAL | 0 refills | Status: DC

## 2017-09-11 NOTE — Clinical Social Work Maternal (Signed)
CLINICAL SOCIAL WORK MATERNAL/CHILD NOTE  Patient Details  Name: Elmer Picker MRN: [id number] Date of Birth: 09/20/1990  Date:  09-Jul-2017  Clinical Social Worker Initiating Note:  Terri Piedra, LCSW Date/Time: Initiated:  09/11/17/0840     Child's Name:  Not yet named   Biological Parents:  Mother(Dominiqua Burt Knack)   Need for Interpreter:  None   Reason for Referral:  Current Substance Use/Substance Use During Pregnancy , Behavioral Health Concerns   Address:  [place] [address] (this is where MOB states she receives her mail.  She notes this is her "baby father's mother's address.) MOB's current address is: [address], [place], [place] [address]  with her cousin [place].   Phone number:  [phone] (home)     Additional phone number:           Household Members/Support Persons (HM/SP):   Household Member/Support Person 1, Household Member/Support Person 2   HM/SP Name Relationship DOB or Age  HM/SP -25 [place] cousin    HM/SP -2 Kathryne Hitch son 05/02/13  HM/SP -3        HM/SP -4        HM/SP -5        HM/SP -6        HM/SP -7        HM/SP -8          Natural Supports (not living in the home):  Parent, Children(MOB reports that her mother is supportive and currently "helping out" with her 33 year old daughter.)   Professional Supports: None   Employment:     Type of Work:     Education:      Homebound arranged:    Museum/gallery curator Resources:  Medicaid   Other Resources:  [employer], Physicist, medical    Cultural/Religious Considerations Which May Impact Care: None stated.  MOB's facesheet notes religion as Non-Denominational  Strengths:  Ability to meet basic needs , Home prepared for child , Pediatrician chosen   Psychotropic Medications:         Pediatrician:    [place]  Pediatrician List:   [facility] ([address])  [place]      Pediatrician Fax Number:    Risk Factors/Current Problems:  Substance Use    Cognitive State:  Able to Concentrate , Alert , Linear Thinking , Insightful    Mood/Affect:  Interested , Calm , Other (Comment)(MOB appeared very tired and states she well but tired.)   CSW Assessment: CSW met with MOB in her first floor room/118 to offer support and complete assessment due to marijuana use in pregnancy and hx of depression.  Upon chart review, CSW notes no documentation of a dx of depression, however, sees that MOB met with [facility] in November of 2018 related to irritability, stress and difficulty sleeping. MOB was pleasant and receptive to CSW's visit, although the lights were off and CSW was not sure if she was awake when CSW knocked on her door.  She states she is very tired, but awake and agreeable to speaking with CSW at this time.  Baby was in bed with her, so CSW made sure MOB was not sleeping and then reviewed SIDS precautions with her.  She stated understanding and reports that her mother has purchased a bassinet for baby and will  be dropping it off today.  She assured CSW that she will have the bed today.   MOB shared her birth story and states it was hectic.  She reports that she was taking her daughter to school and began to have sudden strong contractions.  She states that she couldn't make it to the hospital on her own and called 911.  She states she was 8cm when she arrived and was being prepped for a c-section due to baby's drop in heart rate when she delivered.  She states, "I'm just glad it's over."  She reports wanting a BTL, but states she has signed papers 3 times and each time she was told that there was an error with the documents.  She told CSW "I was looking forward to that (BTL)."  She states her nurse is going to bring her more papers.  CSW asked that she have them reviewed for accuracy before she discharges.  MOB agreed. MOB  reports that FOB is not involved and that "it's easier this way."  She reports he knows baby has been born.  She states she was staying at his mother's house earlier in the pregnancy, which is when she was 27 feeling stressed.  She states "my spirits have been up" and reports a better living situation since moving in with her cousin.  She states she can stay with her cousin as long as needed.  She also mentions that her 6 year old daughter has been staying with her mother for the last month or two "to help me out."  She denies any current emotions concerns or hx of mental illness.  She was engaged and attentive to information given regarding PMADs. CSW inquired about marijuana use noted in MOB's chart.  MOB admits to smoking marijuana to "help with morning sickness and appetite."  She states she "found out late (about her pregnancy)," but when she found out she "cut back to 28 time per day."  CSW questioned her about when she found out since it is documented that she started PNC around 13 weeks.  She agreed and added, "I think I was around 3-4 months pregnant."  CSW explained hospital drug screen policy and mandated reporting.  MOB was understanding and thinks this happened when her 4 year old son was born.  She asked what she might expect from their involvement and CSW provided information.  She states no further questions or concerns.     CSW Plan/Description:  No Further Intervention Required/No Barriers to Discharge, Sudden Infant Death Syndrome (SIDS) Education, Perinatal Mood and Anxiety Disorder (PMADs) Education, Hospital Drug Screen Policy Information, Child Protective Service Report , CSW Will Continue to Monitor Umbilical Cord Tissue Drug Screen Results and Make Report if Warranted    Kobe Jansma Elizabeth, LCSW 09/11/2017, 11:21 AM 

## 2017-09-11 NOTE — Discharge Instructions (Signed)
Vaginal Delivery, Care After °Refer to this sheet in the next few weeks. These instructions provide you with information about caring for yourself after vaginal delivery. Your health care provider may also give you more specific instructions. Your treatment has been planned according to current medical practices, but problems sometimes occur. Call your health care provider if you have any problems or questions. °What can I expect after the procedure? °After vaginal delivery, it is common to have: °· Some bleeding from your vagina. °· Soreness in your abdomen, your vagina, and the area of skin between your vaginal opening and your anus (perineum). °· Pelvic cramps. °· Fatigue. ° °Follow these instructions at home: °Medicines °· Take over-the-counter and prescription medicines only as told by your health care provider. °· If you were prescribed an antibiotic medicine, take it as told by your health care provider. Do not stop taking the antibiotic until it is finished. °Driving ° °· Do not drive or operate heavy machinery while taking prescription pain medicine. °· Do not drive for 24 hours if you received a sedative. °Lifestyle °· Do not drink alcohol. This is especially important if you are breastfeeding or taking medicine to relieve pain. °· Do not use tobacco products, including cigarettes, chewing tobacco, or e-cigarettes. If you need help quitting, ask your health care provider. °Eating and drinking °· Drink at least 8 eight-ounce glasses of water every day unless you are told not to by your health care provider. If you choose to breastfeed your baby, you may need to drink more water than this. °· Eat high-fiber foods every day. These foods may help prevent or relieve constipation. High-fiber foods include: °? Whole grain cereals and breads. °? Brown rice. °? Beans. °? Fresh fruits and vegetables. °Activity °· Return to your normal activities as told by your health care provider. Ask your health care provider  what activities are safe for you. °· Rest as much as possible. Try to rest or take a nap when your baby is sleeping. °· Do not lift anything that is heavier than your baby or 10 lb (4.5 kg) until your health care provider says that it is safe. °· Talk with your health care provider about when you can engage in sexual activity. This may depend on your: °? Risk of infection. °? Rate of healing. °? Comfort and desire to engage in sexual activity. °Vaginal Care °· If you have an episiotomy or a vaginal tear, check the area every day for signs of infection. Check for: °? More redness, swelling, or pain. °? More fluid or blood. °? Warmth. °? Pus or a bad smell. °· Do not use tampons or douches until your health care provider says this is safe. °· Watch for any blood clots that may pass from your vagina. These may look like clumps of dark red, brown, or black discharge. °General instructions °· Keep your perineum clean and dry as told by your health care provider. °· Wear loose, comfortable clothing. °· Wipe from front to back when you use the toilet. °· Ask your health care provider if you can shower or take a bath. If you had an episiotomy or a perineal tear during labor and delivery, your health care provider may tell you not to take baths for a certain length of time. °· Wear a bra that supports your breasts and fits you well. °· If possible, have someone help you with household activities and help care for your baby for at least a few days after   you leave the hospital. °· Keep all follow-up visits for you and your baby as told by your health care provider. This is important. °Contact a health care provider if: °· You have: °? Vaginal discharge that has a bad smell. °? Difficulty urinating. °? Pain when urinating. °? A sudden increase or decrease in the frequency of your bowel movements. °? More redness, swelling, or pain around your episiotomy or vaginal tear. °? More fluid or blood coming from your episiotomy or  vaginal tear. °? Pus or a bad smell coming from your episiotomy or vaginal tear. °? A fever. °? A rash. °? Little or no interest in activities you used to enjoy. °? Questions about caring for yourself or your baby. °· Your episiotomy or vaginal tear feels warm to the touch. °· Your episiotomy or vaginal tear is separating or does not appear to be healing. °· Your breasts are painful, hard, or turn red. °· You feel unusually sad or worried. °· You feel nauseous or you vomit. °· You pass large blood clots from your vagina. If you pass a blood clot from your vagina, save it to show to your health care provider. Do not flush blood clots down the toilet without having your health care provider look at them. °· You urinate more than usual. °· You are dizzy or light-headed. °· You have not breastfed at all and you have not had a menstrual period for 12 weeks after delivery. °· You have stopped breastfeeding and you have not had a menstrual period for 12 weeks after you stopped breastfeeding. °Get help right away if: °· You have: °? Pain that does not go away or does not get better with medicine. °? Chest pain. °? Difficulty breathing. °? Blurred vision or spots in your vision. °? Thoughts about hurting yourself or your baby. °· You develop pain in your abdomen or in one of your legs. °· You develop a severe headache. °· You faint. °· You bleed from your vagina so much that you fill two sanitary pads in one hour. °This information is not intended to replace advice given to you by your health care provider. Make sure you discuss any questions you have with your health care provider. °Document Released: 06/02/2000 Document Revised: 11/17/2015 Document Reviewed: 06/20/2015 °Elsevier Interactive Patient Education © 2018 [employer] ° °

## 2017-09-11 NOTE — Discharge Summary (Addendum)
OB Discharge Summary    Patient Name: Dana Mcconnell DOB: 1990-08-09 MRN: [id number]  Date of admission: 09/10/2017 Delivering MD: Caryl Ada Y   Date of discharge: 09/11/2017  Admitting diagnosis: LABOR Intrauterine pregnancy: [redacted]w[redacted]d     Secondary diagnosis:  Principal Problem:   Status post vacuum-assisted vaginal delivery Active Problems:   Depression   Anemia affecting pregnancy in third trimester   Pregnancy   VBAC (vaginal birth after Cesarean)  Additional problems: Sickle cell trait Previous C/S for failure to descend Previous PTL Anemia during pregnancy (~9.0) ASCUS on PAP - needs postpartum work-up     Discharge diagnosis: Term Pregnancy Delivered and VBAC                                                                                               Post partum procedures: None  Augmentation:  None  Complications: Retained placenta  Hospital course:  Onset of Labor With Vaginal Delivery     27 y.o. yo G3P3003 at [redacted]w[redacted]d was admitted in Active Labor on 09/10/2017. Patient had a labor course as follows:  Membrane Rupture Time/Date: 8:09 AM ,09/10/2017   Intrapartum Procedures: Episiotomy: None [1]                                         Lacerations:    None Prior to delivery, prolonged decels were noted that did not resolve with conservative measures. Code Cesarean was called, while in the OR patient bearing down and had a delivery of a Viable infant via VAVD. Retained placenta was manually extracted after ~1hr and was intact. 09/10/2017  Information for the patient's newborn:  Journie, Howson [[id number]]  Delivery Method: VBAC, Vacuum Assisted(Filed from Delivery Summary)   Pateint had an uncomplicated postpartum course.  She is ambulating, tolerating a regular diet, passing flatus, and urinating well. Patient is discharged home in stable condition on 09/11/17.  Physical exam  Vitals:   09/10/17 1430 09/10/17 1704 09/10/17 2131 09/11/17 0500  BP: 114/73 (!)  112/58 (!) 105/54 107/68  Pulse: 91 79 75 73  Resp: 18 18 16 18   Temp: 98.6 F (37 C) 98.2 F (36.8 C) 98.5 F (36.9 C) 98.2 F (36.8 C)  TempSrc: Oral Oral Oral Oral  Weight:      Height:       General: alert, cooperative and no distress Lochia: appropriate Uterine Fundus: firm Incision: N/A DVT Evaluation: No evidence of DVT seen on physical exam. Labs: Lab Results  Component Value Date   WBC 5.8 09/10/2017   HGB 10.2 (L) 09/10/2017   HCT 29.3 (L) 09/10/2017   MCV 90.4 09/10/2017   PLT 192 09/10/2017   CMP Latest Ref Rng & Units 06/17/2014  Glucose 70 - 99 mg/dL 413(K)  BUN 6 - 23 mg/dL <4(M)  Creatinine 0.10 - 1.10 mg/dL 2.72  Sodium 536 - 644 mmol/L 139  Potassium 3.5 - 5.1 mmol/L 4.3  Chloride 96 - 112 mEq/L 106  CO2 19 - 32 mmol/L 28  Calcium 8.4 - 10.5 mg/dL 9.5  Total Protein 6.0 - 8.3 g/dL -  Total Bilirubin 0.3 - 1.2 mg/dL -  Alkaline Phos 39 - 161117 U/L -  AST 0 - 37 U/L -  ALT 0 - 35 U/L -    Discharge instruction: per After Visit Summary and "Baby and Me Booklet".  After visit meds:  Allergies as of 09/11/2017   No Known Allergies     Medication List    STOP taking these medications   butalbital-acetaminophen-caffeine 50-325-40 MG tablet Commonly known as:  FIORICET, ESGIC   ferrous sulfate 325 (65 FE) MG tablet     TAKE these medications   albuterol 108 (90 Base) MCG/ACT inhaler Commonly known as:  PROVENTIL HFA;VENTOLIN HFA Inhale 2 puffs into the lungs every 6 (six) hours as needed for wheezing or shortness of breath.   ibuprofen 600 MG tablet Commonly known as:  ADVIL,MOTRIN Take 1 tablet (600 mg total) by mouth every 6 (six) hours.   Prenatal Vitamins 0.8 MG tablet Take 1 tablet by mouth daily.       Diet: routine diet  Activity: Advance as tolerated. Pelvic rest for 6 weeks.   Outpatient follow up:4 weeks, PP BH visit Follow up Appt:No future appointments. Follow up Visit: Follow-up Information    [facility]   Specialty:  Obstetrics and Gynecology Why:  in 4 weeks, [facility] Contact information: [address] [place] [id number] [phone]          Postpartum contraception: Tubal Ligation (interval)  Newborn Data: Live born female  Birth Weight: 7 lb 4.1 oz (3290 g) APGAR: 9, 9  Newborn Delivery   Birth date/time:  09/10/2017 08:55:00 Delivery type:  VBAC, Vacuum Assisted     Baby Feeding: Bottle Disposition:home with mother   09/11/2017 Ellwood DenseAlison Rumball, DO  OB FELLOW DISCHARGE ATTESTATION  I have seen and examined this patient. I agree with above documentation and have made edits as needed.   Caryl AdaJazma Phelps, DO OB Fellow 8:35 AM

## 2017-09-13 ENCOUNTER — Encounter: Payer: Self-pay | Admitting: Student

## 2017-10-01 ENCOUNTER — Encounter (HOSPITAL_BASED_OUTPATIENT_CLINIC_OR_DEPARTMENT_OTHER): Payer: Self-pay | Admitting: *Deleted

## 2017-10-01 ENCOUNTER — Other Ambulatory Visit: Payer: Self-pay

## 2017-10-09 NOTE — Addendum Note (Signed)
Addendum  created 10/09/17 1128 by Donney DiceKelly, Zoha Spranger D, CRNA   Charge Capture section accepted, Visit diagnoses modified

## 2017-10-11 ENCOUNTER — Encounter: Payer: Self-pay | Admitting: General Practice

## 2017-10-11 ENCOUNTER — Encounter: Payer: Self-pay | Admitting: Obstetrics and Gynecology

## 2017-10-11 ENCOUNTER — Ambulatory Visit (INDEPENDENT_AMBULATORY_CARE_PROVIDER_SITE_OTHER): Payer: Medicaid Other | Admitting: Obstetrics and Gynecology

## 2017-10-11 ENCOUNTER — Other Ambulatory Visit (HOSPITAL_COMMUNITY)
Admission: RE | Admit: 2017-10-11 | Discharge: 2017-10-11 | Disposition: A | Discharge status: Home / Self Care | Payer: Medicaid Other | Source: Ambulatory Visit | Attending: Obstetrics and Gynecology | Admitting: Obstetrics and Gynecology

## 2017-10-11 VITALS — BP 112/77 | HR 59 | Ht 65.0 in | Wt 145.7 lb

## 2017-10-11 DIAGNOSIS — Z1389 Encounter for screening for other disorder: Secondary | ICD-10-CM

## 2017-10-11 DIAGNOSIS — R87810 Cervical high risk human papillomavirus (HPV) DNA test positive: Secondary | ICD-10-CM | POA: Insufficient documentation

## 2017-10-11 DIAGNOSIS — R87610 Atypical squamous cells of undetermined significance on cytologic smear of cervix (ASC-US): Secondary | ICD-10-CM

## 2017-10-11 NOTE — Progress Notes (Signed)
Obstetrics Visit Postpartum Visit  Appointment Date: 10/11/2017  [facility]  Primary Care Provider: Patient, No Pcp Per  Chief Complaint:  Chief Complaint  Patient presents with  . Postpartum Care    History of Present Illness: Dana Mcconnell is a 27 y.o. Caucasian [id number] (LMP: none yet), seen for the above chief complaint. Her past medical history is significant for +lewis antibodies, h/o VBAC, BMI 28, h/o umbilical hernia repair as young child   She is s/p VBAC on 3/25; she was discharged to home on PPD#1  Vaginal bleeding or discharge: still having some lochia Breast or formula feeding: formula feed Intercourse: No  Contraception after delivery: No  PP depression s/s: No, EPDS 2 Any bowel or bladder issues: No  Pap smear: ascus/hpv pos (date: 02/2017). colpo with some awe changes. No bx done.   Review of Systems: as noted in the History of Present Illness.  Patient Active Problem List   Diagnosis Date Noted  . Marijuana use 05/14/2017  . Depression 05/14/2017  . Asthma 03/20/2017  . ASCUS with positive high risk HPV cervical 03/20/2017  . Sickle cell trait (HCC) 03/14/2017    Medications [name] had no medications administered during this visit. Current Outpatient Medications  Medication Sig Dispense Refill  . albuterol (PROVENTIL HFA;VENTOLIN HFA) 108 (90 Base) MCG/ACT inhaler Inhale 2 puffs into the lungs every 6 (six) hours as needed for wheezing or shortness of breath. (Patient not taking: Reported on 10/11/2017) 1 Inhaler 0  . ibuprofen (ADVIL,MOTRIN) 600 MG tablet Take 1 tablet (600 mg total) by mouth every 6 (six) hours. (Patient not taking: Reported on 10/11/2017) 30 tablet 0   No current facility-administered medications for this visit.     Allergies Patient has no known allergies.  Physical Exam:  BP 112/77   Pulse (!) 59   Ht 5\' 5"  (1.651 m)   Wt 145 lb 11.2 oz (66.1 kg)   BMI 24.25 kg/m  Body mass  index is 24.25 kg/m. General appearance: Well nourished, well developed female in no acute distress.  Cardiovascular: normal s1 and s2.  No murmurs, rubs or gallops. Respiratory:  Clear to auscultation bilateral. Normal respiratory effort Abdomen: positive bowel sounds and no masses, hernias; diffusely non tender to palpation, non distended Neuro/Psych:  Normal mood and affect.  Skin:  Warm and dry.  Lymphatic:  No inguinal lymphadenopathy.   Pelvic exam: is not limited by body habitus EGBUS: within normal limits Vagina: within normal limits and with no blood in the vault, Cervix:  Large, multiparous, no lesions or cervical motion tenderness Uterus:  nonenlarged and approximately 8-10 week sized Adnexa:  normal adnexa and no mass, fullness, tenderness Rectovaginal: deferred  Laboratory: none  PP Depression Screening: see above  Assessment: pt doing well  Plan:  Told pt that should expect a period in the next few weeks. Has 5/1 BTL already scheduled with me and pt confirms that she does not want anymore children. Likely will do palmer's point to avoid prior umbilical hernia repair.   RTC 3-4wks for regular post op visit  Cornelia Copaharlie Rosea Dory, Jr MD Attending [facility](Faculty Practice)

## 2017-10-11 NOTE — Progress Notes (Signed)
[facility] to [facility].  Patient stated that she was ok and she didn't need to [facility].

## 2017-10-11 NOTE — BH Specialist Note (Unsigned)
Integrated Behavioral Health Follow Up Visit  MRN: [id number] Name: Dana BodilyJertica S Dudzik  Number of Integrated Behavioral Health Clinician visits: 2/6 Session Start time: ***  Session End time: *** Total time: {IBH Total Time:21014050}  Type of Service: Integrated Behavioral Health- Individual/Family Interpretor:No. Interpretor Name and Language: n/a  SUBJECTIVE: Dana Mcconnell is a 27 y.o. female accompanied by {Patient accompanied [phone]} Patient was referred by Thressa ShellerHeather Hogan, CNM for depression and anxiety. Patient reports the following symptoms/concerns: *** Duration of problem: ***; Severity of problem: {Mild/Moderate/Severe:20260}  OBJECTIVE: Mood: {BHH MOOD:22306} and Affect: {BHH AFFECT:22307} Risk of harm to self or others: {CHL AMB BH Suicide Current Mental Status:21022748}  LIFE CONTEXT: Family and Social: *** School/Work: *** Self-Care: *** Life Changes: ***  GOALS ADDRESSED: Patient will: 1.  Reduce symptoms of: {IBH Symptoms:21014056}  2.  Increase knowledge and/or ability of: {IBH Patient Tools:21014057}  3.  Demonstrate ability to: {IBH Goals:21014053}  INTERVENTIONS: Interventions utilized:  {IBH Interventions:21014054} Standardized Assessments completed: {IBH Screening Tools:21014051}  ASSESSMENT: Patient currently experiencing ***.   Patient may benefit from ***.  PLAN: 1. Follow up with behavioral health clinician on : *** 2. Behavioral recommendations: *** 3. Referral(s): {IBH Referrals:21014055} 4. "From scale of 1-10, how likely are you to follow plan?": ***  Dana LipsJamie C Talyn Eddie, LCSW  Depression screen [facility] 2/9 09/06/2017 08/30/2017 08/23/2017 08/09/2017 07/25/2017  Decreased Interest 1 1 1 1 1   Down, Depressed, Hopeless 1 1 - 1 1  PHQ - 2 Score 2 2 1 2 2   Altered sleeping 2 3 2 3 1   Tired, decreased energy 2 1 2 2 1   Change in appetite 1 1 2 1 2   Feeling bad or failure about yourself  0 0 - 1 1  Trouble concentrating 1 0 1 2 1   Moving  slowly or fidgety/restless 0 0 0 1 0  Suicidal thoughts 0 0 0 0 0  PHQ-9 Score 8 7 8 12 8    GAD 7 : Generalized Anxiety Score 09/06/2017 08/30/2017 08/23/2017 08/09/2017  Nervous, Anxious, on Edge 1 1 1  0  Control/stop worrying 1 1 1 2   Worry too much - different things 1 1 1 2   Trouble relaxing 1 1 1 2   Restless 1 0 0 2  Easily annoyed or irritable 1 1 1 2   Afraid - awful might happen 0 1 0 1  Total GAD 7 Score 6 6 5  11

## 2017-10-16 LAB — CYTOLOGY - PAP
Diagnosis: UNDETERMINED — AB
HPV (WINDOPATH): DETECTED — AB

## 2017-10-17 ENCOUNTER — Ambulatory Visit (HOSPITAL_BASED_OUTPATIENT_CLINIC_OR_DEPARTMENT_OTHER): Payer: Medicaid Other | Admitting: Registered Nurse

## 2017-10-17 ENCOUNTER — Other Ambulatory Visit: Payer: Self-pay

## 2017-10-17 ENCOUNTER — Ambulatory Visit (HOSPITAL_BASED_OUTPATIENT_CLINIC_OR_DEPARTMENT_OTHER)
Admission: RE | Admit: 2017-10-17 | Discharge: 2017-10-17 | Disposition: A | Discharge status: Home / Self Care | Payer: Medicaid Other | Source: Ambulatory Visit | Attending: Obstetrics and Gynecology | Admitting: Obstetrics and Gynecology

## 2017-10-17 ENCOUNTER — Encounter (HOSPITAL_BASED_OUTPATIENT_CLINIC_OR_DEPARTMENT_OTHER): Payer: Self-pay | Admitting: Anesthesiology

## 2017-10-17 ENCOUNTER — Encounter (HOSPITAL_BASED_OUTPATIENT_CLINIC_OR_DEPARTMENT_OTHER)
Admission: RE | Disposition: A | Discharge status: Home / Self Care | Payer: Self-pay | Source: Ambulatory Visit | Attending: Obstetrics and Gynecology

## 2017-10-17 DIAGNOSIS — Z302 Encounter for sterilization: Secondary | ICD-10-CM | POA: Insufficient documentation

## 2017-10-17 DIAGNOSIS — Z79899 Other long term (current) drug therapy: Secondary | ICD-10-CM | POA: Insufficient documentation

## 2017-10-17 DIAGNOSIS — F329 Major depressive disorder, single episode, unspecified: Secondary | ICD-10-CM | POA: Diagnosis not present

## 2017-10-17 DIAGNOSIS — D573 Sickle-cell trait: Secondary | ICD-10-CM | POA: Insufficient documentation

## 2017-10-17 DIAGNOSIS — Z8719 Personal history of other diseases of the digestive system: Secondary | ICD-10-CM

## 2017-10-17 DIAGNOSIS — Z9889 Other specified postprocedural states: Secondary | ICD-10-CM

## 2017-10-17 HISTORY — PX: LAPAROSCOPIC TUBAL LIGATION: SHX1937

## 2017-10-17 HISTORY — DX: Anemia, unspecified: D64.9

## 2017-10-17 LAB — POCT PREGNANCY, URINE: Preg Test, Ur: NEGATIVE

## 2017-10-17 SURGERY — LIGATION, FALLOPIAN TUBE, LAPAROSCOPIC
Anesthesia: General | Site: Abdomen | Laterality: Bilateral

## 2017-10-17 MED ORDER — ROCURONIUM BROMIDE 10 MG/ML (PF) SYRINGE
PREFILLED_SYRINGE | INTRAVENOUS | Status: AC
  Filled 2017-10-17: qty 5

## 2017-10-17 MED ORDER — LIDOCAINE 2% (20 MG/ML) 5 ML SYRINGE
INTRAMUSCULAR | Status: DC | PRN
  Administered 2017-10-17: 60 mg via INTRAVENOUS

## 2017-10-17 MED ORDER — KETOROLAC TROMETHAMINE 30 MG/ML IJ SOLN
INTRAMUSCULAR | Status: AC
  Filled 2017-10-17: qty 1

## 2017-10-17 MED ORDER — ROCURONIUM BROMIDE 100 MG/10ML IV SOLN
INTRAVENOUS | Status: DC | PRN
  Administered 2017-10-17: 30 mg via INTRAVENOUS

## 2017-10-17 MED ORDER — ONDANSETRON HCL 4 MG/2ML IJ SOLN
INTRAMUSCULAR | Status: AC
  Filled 2017-10-17: qty 2

## 2017-10-17 MED ORDER — LIDOCAINE HCL (CARDIAC) PF 100 MG/5ML IV SOSY
PREFILLED_SYRINGE | INTRAVENOUS | Status: AC
  Filled 2017-10-17: qty 5

## 2017-10-17 MED ORDER — BUPIVACAINE HCL 0.5 % IJ SOLN
INTRAMUSCULAR | Status: DC | PRN
  Administered 2017-10-17: 10 mL

## 2017-10-17 MED ORDER — ACETAMINOPHEN 500 MG PO TABS
1000.0000 mg | ORAL_TABLET | Freq: Once | ORAL | Status: AC
  Administered 2017-10-17: 1000 mg via ORAL

## 2017-10-17 MED ORDER — DEXAMETHASONE SODIUM PHOSPHATE 4 MG/ML IJ SOLN
INTRAMUSCULAR | Status: DC | PRN
  Administered 2017-10-17: 10 mg via INTRAVENOUS

## 2017-10-17 MED ORDER — FENTANYL CITRATE (PF) 100 MCG/2ML IJ SOLN
INTRAMUSCULAR | Status: AC
  Filled 2017-10-17: qty 2

## 2017-10-17 MED ORDER — SUGAMMADEX SODIUM 200 MG/2ML IV SOLN
INTRAVENOUS | Status: DC | PRN
  Administered 2017-10-17: 200 mg via INTRAVENOUS

## 2017-10-17 MED ORDER — PHENYLEPHRINE 40 MCG/ML (10ML) SYRINGE FOR IV PUSH (FOR BLOOD PRESSURE SUPPORT)
PREFILLED_SYRINGE | INTRAVENOUS | Status: DC | PRN
  Administered 2017-10-17: 80 ug via INTRAVENOUS

## 2017-10-17 MED ORDER — MIDAZOLAM HCL 2 MG/2ML IJ SOLN
1.0000 mg | INTRAMUSCULAR | Status: DC | PRN
  Administered 2017-10-17: 2 mg via INTRAVENOUS

## 2017-10-17 MED ORDER — DEXAMETHASONE SODIUM PHOSPHATE 10 MG/ML IJ SOLN
INTRAMUSCULAR | Status: AC
  Filled 2017-10-17: qty 1

## 2017-10-17 MED ORDER — ONDANSETRON HCL 4 MG/2ML IJ SOLN
INTRAMUSCULAR | Status: DC | PRN
  Administered 2017-10-17: 4 mg via INTRAVENOUS

## 2017-10-17 MED ORDER — ACETAMINOPHEN 500 MG PO TABS
ORAL_TABLET | ORAL | Status: AC
  Filled 2017-10-17: qty 2

## 2017-10-17 MED ORDER — OXYCODONE-ACETAMINOPHEN 5-325 MG PO TABS: 1.0000 | ORAL_TABLET | Freq: Four times a day (QID) | ORAL | 0 refills | Status: DC | PRN

## 2017-10-17 MED ORDER — PROMETHAZINE HCL 25 MG/ML IJ SOLN: 6.2500 mg | INTRAMUSCULAR | Status: DC | PRN

## 2017-10-17 MED ORDER — LACTATED RINGERS IV SOLN
INTRAVENOUS | Status: DC
  Administered 2017-10-17: 11:00:00 via INTRAVENOUS

## 2017-10-17 MED ORDER — ESMOLOL HCL 100 MG/10ML IV SOLN
INTRAVENOUS | Status: AC
  Filled 2017-10-17: qty 10

## 2017-10-17 MED ORDER — SUCCINYLCHOLINE CHLORIDE 20 MG/ML IJ SOLN
INTRAMUSCULAR | Status: DC | PRN
  Administered 2017-10-17: 100 mg via INTRAVENOUS

## 2017-10-17 MED ORDER — SCOPOLAMINE 1 MG/3DAYS TD PT72: 1.0000 | MEDICATED_PATCH | Freq: Once | TRANSDERMAL | Status: DC | PRN

## 2017-10-17 MED ORDER — KETOROLAC TROMETHAMINE 30 MG/ML IJ SOLN
30.0000 mg | Freq: Once | INTRAMUSCULAR | Status: AC
  Administered 2017-10-17: 30 mg via INTRAVENOUS

## 2017-10-17 MED ORDER — PROPOFOL 10 MG/ML IV BOLUS
INTRAVENOUS | Status: DC | PRN
  Administered 2017-10-17: 30 mg via INTRAVENOUS
  Administered 2017-10-17: 150 mg via INTRAVENOUS

## 2017-10-17 MED ORDER — OXYCODONE HCL 5 MG PO TABS: 5.0000 mg | ORAL_TABLET | Freq: Once | ORAL | Status: DC

## 2017-10-17 MED ORDER — FENTANYL CITRATE (PF) 100 MCG/2ML IJ SOLN
50.0000 ug | INTRAMUSCULAR | Status: DC | PRN
  Administered 2017-10-17: 100 ug via INTRAVENOUS

## 2017-10-17 MED ORDER — SOD CITRATE-CITRIC ACID 500-334 MG/5ML PO SOLN
ORAL | Status: AC
  Filled 2017-10-17: qty 30

## 2017-10-17 MED ORDER — ESMOLOL HCL 100 MG/10ML IV SOLN
INTRAVENOUS | Status: DC | PRN
  Administered 2017-10-17: 30 mg via INTRAVENOUS

## 2017-10-17 MED ORDER — MIDAZOLAM HCL 2 MG/2ML IJ SOLN
INTRAMUSCULAR | Status: AC
  Filled 2017-10-17: qty 2

## 2017-10-17 MED ORDER — SUGAMMADEX SODIUM 200 MG/2ML IV SOLN
INTRAVENOUS | Status: AC
  Filled 2017-10-17: qty 2

## 2017-10-17 MED ORDER — SOD CITRATE-CITRIC ACID 500-334 MG/5ML PO SOLN
30.0000 mL | ORAL | Status: AC
  Administered 2017-10-17: 30 mL via ORAL

## 2017-10-17 MED ORDER — LACTATED RINGERS IV SOLN: INTRAVENOUS | Status: DC

## 2017-10-17 MED ORDER — FENTANYL CITRATE (PF) 100 MCG/2ML IJ SOLN
25.0000 ug | INTRAMUSCULAR | Status: DC | PRN
  Administered 2017-10-17: 25 ug via INTRAVENOUS

## 2017-10-17 MED ORDER — SUCCINYLCHOLINE CHLORIDE 200 MG/10ML IV SOSY
PREFILLED_SYRINGE | INTRAVENOUS | Status: AC
  Filled 2017-10-17: qty 10

## 2017-10-17 MED ORDER — PHENYLEPHRINE 40 MCG/ML (10ML) SYRINGE FOR IV PUSH (FOR BLOOD PRESSURE SUPPORT)
PREFILLED_SYRINGE | INTRAVENOUS | Status: AC
  Filled 2017-10-17: qty 10

## 2017-10-17 MED ORDER — FENTANYL CITRATE (PF) 100 MCG/2ML IJ SOLN
INTRAMUSCULAR | Status: AC
Start: 2017-10-17 — End: 2017-10-17
  Filled 2017-10-17: qty 2

## 2017-10-17 MED ORDER — PROPOFOL 500 MG/50ML IV EMUL
INTRAVENOUS | Status: AC
  Filled 2017-10-17: qty 50

## 2017-10-17 SURGICAL SUPPLY — 33 items
BLADE SURG 15 STRL LF DISP TIS (BLADE) ×1 IMPLANT
BLADE SURG 15 STRL SS (BLADE) ×2
BRIEF STRETCH FOR OB PAD XXL (UNDERPADS AND DIAPERS) ×3 IMPLANT
CLIP FILSHIE TUBAL LIGA STRL (Clip) ×3 IMPLANT
CLOTH BEACON ORANGE TIMEOUT ST (SAFETY) IMPLANT
DERMABOND ADVANCED (GAUZE/BANDAGES/DRESSINGS) ×2
DERMABOND ADVANCED .7 DNX12 (GAUZE/BANDAGES/DRESSINGS) ×1 IMPLANT
DRSG OPSITE POSTOP 3X4 (GAUZE/BANDAGES/DRESSINGS) IMPLANT
DURAPREP 26ML APPLICATOR (WOUND CARE) ×3 IMPLANT
GLOVE BIOGEL PI IND STRL 7.0 (GLOVE) ×2 IMPLANT
GLOVE BIOGEL PI IND STRL 7.5 (GLOVE) ×2 IMPLANT
GLOVE BIOGEL PI INDICATOR 7.0 (GLOVE) ×4
GLOVE BIOGEL PI INDICATOR 7.5 (GLOVE) ×4
GLOVE SURG SS PI 7.0 STRL IVOR (GLOVE) ×3 IMPLANT
GOWN STRL REUS W/TWL LRG LVL3 (GOWN DISPOSABLE) ×3 IMPLANT
PACK LAPAROSCOPY BASIN (CUSTOM PROCEDURE TRAY) ×3 IMPLANT
PACK TRENDGUARD 450 HYBRID PRO (MISCELLANEOUS) ×1 IMPLANT
PACK TRENDGUARD 600 HYBRD PROC (MISCELLANEOUS) IMPLANT
PAD OB MATERNITY 4.3X12.25 (PERSONAL CARE ITEMS) ×3 IMPLANT
PAD PREP 24X48 CUFFED NSTRL (MISCELLANEOUS) ×3 IMPLANT
SUT MNCRL AB 4-0 PS2 18 (SUTURE) ×3 IMPLANT
SUT VICRYL 0 UR6 27IN ABS (SUTURE) ×3 IMPLANT
SUT VICRYL 4-0 PS2 18IN ABS (SUTURE) IMPLANT
TOWEL OR 17X24 6PK STRL BLUE (TOWEL DISPOSABLE) ×3 IMPLANT
TRAY FOLEY W/BAG SLVR 14FR LF (SET/KITS/TRAYS/PACK) ×3 IMPLANT
TRENDGUARD 450 HYBRID PRO PACK (MISCELLANEOUS) ×3
TRENDGUARD 600 HYBRID PROC PK (MISCELLANEOUS)
TROCAR ADV FIXATION 5X100MM (TROCAR) IMPLANT
TROCAR BALLN 12MMX100 BLUNT (TROCAR) ×3 IMPLANT
TROCAR XCEL NON-BLD 11X100MML (ENDOMECHANICALS) ×3 IMPLANT
TUBING INSUF HEATED (TUBING) ×3 IMPLANT
UNDERPAD 30X30 (UNDERPADS AND DIAPERS) ×3 IMPLANT
WARMER LAPAROSCOPE (MISCELLANEOUS) IMPLANT

## 2017-10-17 NOTE — Anesthesia Procedure Notes (Signed)
Procedure Name: Intubation Date/Time: 10/17/2017 11:04 AM Performed by: Caren Macadam, CRNA Pre-anesthesia Checklist: Patient identified, Emergency Drugs available, Suction available and Patient being monitored Patient Re-evaluated:Patient Re-evaluated prior to induction Oxygen Delivery Method: Circle system utilized Preoxygenation: Pre-oxygenation with 100% oxygen Induction Type: IV induction Ventilation: Mask ventilation without difficulty Laryngoscope Size: Miller and 2 Grade View: Grade I Tube type: Oral Tube size: 7.0 mm Number of attempts: 1 Airway Equipment and Method: Stylet and Oral airway Placement Confirmation: ETT inserted through vocal cords under direct vision,  positive ETCO2 and breath sounds checked- equal and bilateral Secured at: 22 cm Tube secured with: Tape Dental Injury: Teeth and Oropharynx as per pre-operative assessment

## 2017-10-17 NOTE — H&P (Signed)
Surgical Pre Op H&P  Surgery Date: 10/17/2017  OBGYN Clinic: [facility]  Primary Care Provider: Patient, No Pcp Per  Chief Complaint:  Scheduled surgery  History of Present Illness: Dana Mcconnell is a 27 y.o. African American [phone] (LMP: none yet), seen for the above chief complaint. Her past medical history is significant for +lewis antibodies, h/o VBAC, BMI 28, h/o umbilical hernia repair as young child   She is s/p VBAC on 3/25; she was discharged to home on PPD#1  She had a normal 4/25 postpartum visit and desired BTL. Papers are UTD  Review of Systems: as noted in the History of Present Illness.  Medications Kynzee S. Bertini had no medications administered during this visit. Current Facility-Administered Medications  Medication Dose Route Frequency Provider Last Rate Last Dose  . acetaminophen (TYLENOL) tablet 1,000 mg  1,000 mg Oral Once Cecile Hearing, MD      . fentaNYL (SUBLIMAZE) injection 50-100 mcg  50-100 mcg Intravenous PRN Lowella Curb, MD      . lactated ringers infusion   Intravenous Continuous Lowella Curb, MD 10 mL/hr at 10/17/17 1043    . lactated ringers infusion   Intravenous Continuous Camilla Bing, MD      . midazolam (VERSED) injection 1-2 mg  1-2 mg Intravenous PRN Lowella Curb, MD      . scopolamine (TRANSDERM-SCOP) 1 MG/3DAYS 1.5 mg  1 patch Transdermal Once PRN Lowella Curb, MD        Allergies Patient has no known allergies.  Physical Exam:  BP (!) 105/58   Pulse 64   Temp 98.3 F (36.8 C) (Oral)   Resp 20   Ht  (1.651 m)   Wt 151 lb (68.5 kg)   SpO2 100%   Breastfeeding? No Comment: urine pregnancy negative today  BMI 25.13 kg/m  Body mass index is 25.13 kg/m.  Exam from 4/25 clinic visit  General appearance: Well nourished, well developed female in no acute distress.  Cardiovascular: normal s1 and s2.  No murmurs, rubs or gallops. Respiratory:  Clear to auscultation  bilateral. Normal respiratory effort Abdomen: positive bowel sounds and no masses, hernias; diffusely non tender to palpation, non distended Neuro/Psych:  Normal mood and affect.  Skin:  Warm and dry.  Lymphatic:  No inguinal lymphadenopathy.   Pelvic exam: is not limited by body habitus EGBUS: within normal limits Vagina: within normal limits and with no blood in the vault, Cervix:  Large, multiparous, no lesions or cervical motion tenderness Uterus:  nonenlarged and approximately 8-10 week sized Adnexa:  normal adnexa and no mass, fullness, tenderness Rectovaginal: deferred  Laboratory: UPT neg  Assessment: pt doing well  Plan:  Pt confirms still desires BTL. Can proceed when OR is ready.   Cornelia Copa MD Attending [facility])

## 2017-10-17 NOTE — Discharge Instructions (Addendum)
Last Dose of Tylenol 1000 mg taken at 10:50AM.  Laparoscopic Surgery Discharge Instructions  Instructions Following Laparoscopic Surgery You have just undergone a laparoscopic surgery.  The following list should answer your most common questions.  Although we will discuss your surgery and post-operative instructions with you prior to your discharge, this list will serve as a reminder if you fail to recall the details of what we discussed.  We will discuss your surgery once again in detail at your post-op visit in two to four weeks. If you havent already done so, please call to make your appointment as soon as possible.  How you will feel: Although you have just undergone a major surgery, your recovery will be significantly shorter since the surgery was performed through much smaller incisions than the traditional approach.  You should feel slightly better each day.  If you suddenly feel much worse than the prior day, please call the clinic.  Its important during the early part of your recovery that you maintain some activity.  Walking is encouraged.  You will quicken your recovery by continued activity.  Incision:  Your incisions will be closed with dissolvable stitches or surgical adhesive (glue).  There may be Band-aids and/or Steri-strips covering your incisions.  If there is no drainage from the incisions you may remove the Band-aids in one to two days.  You may notice some minor bruising at the incision sites.  This is common and will resolve within several days.  Please inform us if the redness at the edges of your incision appears to be spreading.  If the skin around your incision becomes warm to the touch, or if you notice a pus-like drainage, please call the office.  Stairs/Driving/Activities: You may climb stairs if necessary.  If youve had general anesthesia, do not drive a car the rest of the day today.  You may begin light housework when you feel up to it, but avoid heavy lifting (more  than 15-20lbs) or pushing until cleared for these activities by your physician.  Hygiene:  Do not soak your incisions.  Showers are acceptable but you may not take a bath or swim in a pool.  Cleanse your incisions daily with soap and water.  Medications:  Please resume taking any medications that you were taking prior to the surgery.  If we have prescribed any new medications for you, please take them as directed.  Constipation:  It is fairly common to experience some difficulty in moving your bowels following major surgery.  Being active will help to reduce this likelihood. A diet rich in fiber and plenty of liquids is desirable.  If you do become constipated, a mild laxative such as Miralax, Milk of Magnesia, or Metamucil, or a stool softener such as Colace, is recommended.  General Instructions: If you develop a fever of 100.5 degrees or higher, please call the office number(s) below for physician on call.   Post Anesthesia Home Care Instructions  Activity: Get plenty of rest for the remainder of the day. A responsible individual must stay with you for 24 hours following the procedure.  For the next 24 hours, DO NOT: -Drive a car -Advertising copywriter -Drink alcoholic beverages -Take any medication unless instructed by your physician -Make any legal decisions or sign important papers.  Meals: Start with liquid foods such as gelatin or soup. Progress to regular foods as tolerated. Avoid greasy, spicy, heavy foods. If nausea and/or vomiting occur, drink only clear liquids until the nausea and/or vomiting subsides.  Call your physician if vomiting continues.  Special Instructions/Symptoms: Your throat may feel dry or sore from the anesthesia or the breathing tube placed in your throat during surgery. If this causes discomfort, gargle with warm salt water. The discomfort should disappear within 24 hours.  If you had a scopolamine patch placed behind your ear for the management of post-  operative nausea and/or vomiting:  1. The medication in the patch is effective for 72 hours, after which it should be removed.  Wrap patch in a tissue and discard in the trash. Wash hands thoroughly with soap and water. 2. You may remove the patch earlier than 72 hours if you experience unpleasant side effects which may include dry mouth, dizziness or visual disturbances. 3. Avoid touching the patch. Wash your hands with soap and water after contact with the patch.

## 2017-10-17 NOTE — Op Note (Signed)
Operative Note   10/17/2017  PRE-OP DIAGNOSIS: Desire for permanent sterilization   POST-OP DIAGNOSIS: Same. Lynnae January   SURGEON: Surgeon(s) and Role:    * Jenkins Bing, MD - Primary  ASSISTANT: None  ANESTHESIA: General and local   PROCEDURE: LAPAROSCOPIC TUBAL LIGATION VIA FILSHIE CLIPS  ESTIMATED BLOOD LOSS: 10mL   DRAINS: indwelling foley UOP   TOTAL IV FLUIDS: per anesthesia note  SPECIMENS: none   VTE PROPHYLAXIS: SCDs to the bilateral lower extremities  ANTIBIOTICS: not indicated  COMPLICATIONS: none  DISPOSITION: PACU - hemodynamically stable.  CONDITION: stable  FINDINGS: Exam under anesthesia revealed an anteverted uterus approximately 8 week size, normal shape, and no adnexal masses. Pt sounded to 10.5cm. Laparoscopic survey of the abdomen revealed a grossly normal uterus, tubes, ovaries, and stomach edge and gallbladder edge. ; no intra-abdominal adhesions were noted. Extensive fitz hugh curtis adhesions of the liver noted  PROCEDURE IN DETAIL: The patient was taken to the OR where anesthesia was administed. The patient was positioned in dorsal lithotomy in the [place] stirrups. The patient was then examined under anesthesia with the above noted findings. The patient was prepped and draped in the normal sterile fashion and foley catheter was placed. A Graves speculum was placed in the vagina and the anterior lip of the cervix was grasped with a single toothed tenaculum.  A Hulka uterine manipulator was then inserted in the uterus and uterine mobility was found to be satisfactory; the speculum and tenaculum were then removed.  After changing gloves, attention was turned to the patient's abdomen where a 10 mm skin incision was made in palmer's point after confirmation of NG/OG tube placement, after injection of local anesthesia. The Veress step needle was carefully introduced into the peritoneal cavity at a 80 degree angle while tenting up the anterior  abdominal wall. Intraperitoneal placement was confirmed by the use aspiration via syringe, drop test with a water-filled syringe, and with the opening pressure being 6 mmHg. Pneumoperitoneum was obtained and 5mm port was then placed and camera placed and no injuries noted in entrance area and above noted findings seen. The patient was placed in Trendelenburg and then the 10mm port was then placed in the suprapubic port after injection of local anesthesia and with direct visualization.  Next a blunt probe was inserted, via the operative laparoscope, and the bilateral tubes were traced out to their respective fimbriae, with the above noted findings. Next, a Filshie clip was loaded and the placed across the tube approximately one third of the way down the tube from the uterus. The placement was inspected and complete occlusion of the tube, with the bottom jaw on the outside of the mesosalpinx noted. This was down on the right tube.  The suprapubic port was removed under direct visualization and the LUQ port was removed after placing a blunt probe through it. The fascia at the suprapubic site incision was reapproximated with 0 vicryl. The skin was closed with 4-0 monocryl and both port sites covered with dermabond. The Hulka was removed with no bleeding noted from the cervix and all other instrumentation was removed from the vagina.  The foley catheter was removed. The patient tolerated the procedure well. All counts were correct x 2. The patient was transferred to the recovery room awake, alert and breathing independently.   Cornelia Copa MD Attending [facility])

## 2017-10-17 NOTE — Anesthesia Preprocedure Evaluation (Addendum)
Anesthesia Evaluation  Patient identified by MRN, date of birth, ID band Patient awake    Reviewed: Allergy & Precautions, NPO status , Patient's Chart, lab work & pertinent test results  Airway Mallampati: II  TM Distance: >3 FB Neck ROM: Full    Dental  (+) Teeth Intact, Dental Advisory Given   Pulmonary asthma ,    Pulmonary exam normal breath sounds clear to auscultation       Cardiovascular Exercise Tolerance: Good negative cardio ROS Normal cardiovascular exam Rhythm:Regular Rate:Normal     Neuro/Psych PSYCHIATRIC DISORDERS Depression negative neurological ROS     GI/Hepatic negative GI ROS, (+)     substance abuse  marijuana use,   Endo/Other  negative endocrine ROS  Renal/GU negative Renal ROS     Musculoskeletal negative musculoskeletal ROS (+)   Abdominal   Peds  Hematology  (+) Blood dyscrasia, Sickle cell trait ,   Anesthesia Other Findings Day of surgery medications reviewed with the patient.  Reproductive/Obstetrics                            Anesthesia Physical Anesthesia Plan  ASA: II  Anesthesia Plan: General   Post-op Pain Management:    Induction: Intravenous  PONV Risk Score and Plan: 4 or greater and Midazolam, Dexamethasone and Ondansetron  Airway Management Planned: Oral ETT  Additional Equipment:   Intra-op Plan:   Post-operative Plan: Extubation in OR  Informed Consent: I have reviewed the patients History and Physical, chart, labs and discussed the procedure including the risks, benefits and alternatives for the proposed anesthesia with the patient or authorized representative who has indicated his/her understanding and acceptance.   Dental advisory given  Plan Discussed with: CRNA  Anesthesia Plan Comments:        Anesthesia Quick Evaluation

## 2017-10-17 NOTE — Transfer of Care (Signed)
Immediate Anesthesia Transfer of Care Note  Patient: Dana Mcconnell  Procedure(s) Performed: LAPAROSCOPIC TUBAL LIGATION (Bilateral Abdomen)  Patient Location: PACU  Anesthesia Type:General  Level of Consciousness: sedated  Airway & Oxygen Therapy: Patient Spontanous Breathing and Patient connected to face mask oxygen  Post-op Assessment: Report given to RN and Post -op Vital signs reviewed and stable  Post vital signs: Reviewed and stable  Last Vitals:  Vitals Value Taken Time  BP    Temp    Pulse 82 10/17/2017 12:10 PM  Resp 24 10/17/2017 12:10 PM  SpO2 100 % 10/17/2017 12:10 PM  Vitals shown include unvalidated device data.  Last Pain:  Vitals:   10/17/17 1026  TempSrc: Oral  PainSc: 0-No pain         Complications: No apparent anesthesia complications

## 2017-10-18 ENCOUNTER — Telehealth: Payer: Self-pay | Admitting: Obstetrics and Gynecology

## 2017-10-18 ENCOUNTER — Encounter (HOSPITAL_BASED_OUTPATIENT_CLINIC_OR_DEPARTMENT_OTHER): Payer: Self-pay | Admitting: Obstetrics and Gynecology

## 2017-10-18 ENCOUNTER — Encounter (HOSPITAL_COMMUNITY): Payer: Self-pay

## 2017-10-18 NOTE — Anesthesia Postprocedure Evaluation (Signed)
Anesthesia Post Note  Patient: Dana Mcconnell  Procedure(s) Performed: LAPAROSCOPIC TUBAL LIGATION (Bilateral Abdomen)     Patient location during evaluation: PACU Anesthesia Type: General Level of consciousness: awake and alert Pain management: pain level controlled Vital Signs Assessment: post-procedure vital signs reviewed and stable Respiratory status: spontaneous breathing, nonlabored ventilation, respiratory function stable and patient connected to nasal cannula oxygen Cardiovascular status: blood pressure returned to baseline and stable Postop Assessment: no apparent nausea or vomiting Anesthetic complications: no    Last Vitals:  Vitals:   10/17/17 1400 10/17/17 1434  BP:  (!) 123/97  Pulse: 83 73  Resp: (!) 21 (!) 22  Temp:  37 C  SpO2: 100% 100%    Last Pain:  Vitals:   10/17/17 1345  TempSrc:   PainSc: 10-Worst pain ever                 Cecile Hearing

## 2017-10-18 NOTE — Telephone Encounter (Signed)
Refill  On her Pain Medication

## 2017-10-19 ENCOUNTER — Telehealth: Payer: Self-pay

## 2017-10-19 DIAGNOSIS — R52 Pain, unspecified: Secondary | ICD-10-CM

## 2017-10-19 DIAGNOSIS — Z9851 Tubal ligation status: Secondary | ICD-10-CM

## 2017-10-19 MED ORDER — OXYCODONE-ACETAMINOPHEN 5-325 MG PO TABS: 1.0000 | ORAL_TABLET | Freq: Four times a day (QID) | ORAL | 0 refills | Status: AC | PRN

## 2017-10-19 MED ORDER — OXYCODONE-ACETAMINOPHEN 5-325 MG PO TABS: 10.0000 | ORAL_TABLET | Freq: Four times a day (QID) | ORAL | 0 refills | Status: DC | PRN

## 2017-10-19 MED ORDER — OXYCODONE-ACETAMINOPHEN 5-325 MG PO TABS: 1.0000 | ORAL_TABLET | Freq: Four times a day (QID) | ORAL | 0 refills | Status: DC | PRN

## 2017-10-19 NOTE — Telephone Encounter (Signed)
Dana Mcconnell called 10/18/17 am and left a message asking for refill of her percocet pain med - had BTL and only got 4.   Per chart see that a refill was sent in today for refill.

## 2017-10-30 ENCOUNTER — Telehealth: Payer: Self-pay | Admitting: Obstetrics and Gynecology

## 2017-10-30 NOTE — Telephone Encounter (Signed)
GYN Telephone Note Patient called at [phone] and d/w her re: the ascus/hpv positive pap. I told her we can do her colpo when I see her next week for her visit. appt date and time confirmed with her. All questions asked and answered.   Cornelia Copa MD Attending [facility] [employer] (Faculty Practice) 10/30/2017 Time: [phone]

## 2017-11-08 ENCOUNTER — Encounter: Payer: Self-pay | Admitting: Obstetrics and Gynecology

## 2017-11-08 ENCOUNTER — Ambulatory Visit: Payer: Self-pay | Admitting: Obstetrics and Gynecology

## 2017-11-13 ENCOUNTER — Telehealth: Payer: Self-pay | Admitting: General Practice

## 2017-11-13 NOTE — Progress Notes (Signed)
Patient did not keep GYN appointment for 11/08/2017. Will have RN call or send letter about need for colpo  Cornelia Copa MD Attending [facility] for [employer] [facility])

## 2017-11-13 NOTE — Telephone Encounter (Signed)
Patient no showed to colpo appt. Called patient, no answer- left message stating we are trying to reach you regarding your missed appt on 5/23. Please call us back to reschedule your appt. Will send letter.

## 2018-01-31 ENCOUNTER — Encounter (HOSPITAL_COMMUNITY): Payer: Self-pay | Admitting: *Deleted

## 2018-01-31 ENCOUNTER — Emergency Department (HOSPITAL_COMMUNITY)
Admission: EM | Admit: 2018-01-31 | Discharge: 2018-01-31 | Disposition: A | Discharge status: Home / Self Care | Payer: Medicaid Other | Attending: Emergency Medicine | Admitting: Emergency Medicine

## 2018-01-31 ENCOUNTER — Other Ambulatory Visit: Payer: Self-pay

## 2018-01-31 DIAGNOSIS — R42 Dizziness and giddiness: Secondary | ICD-10-CM | POA: Diagnosis not present

## 2018-01-31 DIAGNOSIS — D573 Sickle-cell trait: Secondary | ICD-10-CM | POA: Insufficient documentation

## 2018-01-31 DIAGNOSIS — R110 Nausea: Secondary | ICD-10-CM | POA: Insufficient documentation

## 2018-01-31 LAB — BASIC METABOLIC PANEL
ANION GAP: 8 (ref 5–15)
BUN: 9 mg/dL (ref 6–20)
CALCIUM: 9.4 mg/dL (ref 8.9–10.3)
CO2: 28 mmol/L (ref 22–32)
CREATININE: 0.76 mg/dL (ref 0.44–1.00)
Chloride: 109 mmol/L (ref 98–111)
Glucose, Bld: 91 mg/dL (ref 70–99)
Potassium: 3.6 mmol/L (ref 3.5–5.1)
Sodium: 145 mmol/L (ref 135–145)

## 2018-01-31 LAB — CBC
HCT: 34.5 % — ABNORMAL LOW (ref 36.0–46.0)
Hemoglobin: 10.8 g/dL — ABNORMAL LOW (ref 12.0–15.0)
MCH: 26 pg (ref 26.0–34.0)
MCHC: 31.3 g/dL (ref 30.0–36.0)
MCV: 82.9 fL (ref 78.0–100.0)
PLATELETS: 254 10*3/uL (ref 150–400)
RBC: 4.16 MIL/uL (ref 3.87–5.11)
RDW: 14.8 % (ref 11.5–15.5)
WBC: 3.7 10*3/uL — ABNORMAL LOW (ref 4.0–10.5)

## 2018-01-31 LAB — CBG MONITORING, ED: GLUCOSE-CAPILLARY: 81 mg/dL (ref 70–99)

## 2018-01-31 LAB — I-STAT BETA HCG BLOOD, ED (MC, WL, AP ONLY): I-stat hCG, quantitative: <5 m[IU]/mL (ref <5)

## 2018-01-31 MED ORDER — MECLIZINE HCL 25 MG PO TABS: 25.0000 mg | ORAL_TABLET | Freq: Three times a day (TID) | ORAL | 0 refills | Status: DC | PRN

## 2018-01-31 MED ORDER — MECLIZINE HCL 25 MG PO TABS
25.0000 mg | ORAL_TABLET | Freq: Once | ORAL | Status: AC
  Administered 2018-01-31: 25 mg via ORAL
  Filled 2018-01-31: qty 1

## 2018-01-31 NOTE — ED Triage Notes (Signed)
Pt reports she has been having intermittent dizziness x 6 years ago since she was pregnant with her daughter.  Yesterday, she had a syncopal episode while at her cousins house when she attempted to stand up to get something to drink, breaking her glasses and cutting the side of her L eye.  She is A&Ox 4.  In NAD.  It has been a long time since she saw an eye doctor.  She denies any cp or SOB at present but reports SOB when she feels dizzy.

## 2018-01-31 NOTE — ED Provider Notes (Signed)
[facility]-EMERGENCY DEPT Provider Note   CSN: [id number] Arrival date & time: 01/31/18  1508     History   Chief Complaint Chief Complaint  Patient presents with  . Dizziness    HPI Dana Mcconnell is a 27 y.o. female.   The history is provided by the patient.  Dizziness  Quality:  Room spinning Severity:  Mild Onset quality:  Sudden Timing:  Intermittent Progression:  Waxing and waning Chronicity:  Recurrent Context: head movement and standing up   Relieved by:  Being still Worsened by:  Movement Associated symptoms: nausea   Associated symptoms: no blood in stool, no chest pain, no diarrhea, no headaches, no hearing loss, no palpitations, no shortness of breath, no syncope, no tinnitus, no vision changes, no vomiting and no weakness   Risk factors: no new medications     Past Medical History:  Diagnosis Date  . Anemia    during pregnancy - no current med.  . Asthma    inhaler prn  . Sickle cell trait [facility])     Patient Active Problem List   Diagnosis Date Noted  . 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 Marijuana use 05/14/2017  . Depression 05/14/2017  . Asthma 03/20/2017  . ASCUS with positive high risk HPV cervical 03/20/2017  . Sickle cell trait (HCC) 03/14/2017    Past Surgical History:  Procedure Laterality Date  . CESAREAN SECTION N/A 05/02/2013   Procedure: Primary Ceasrean Section Delivery Baby Boy @ 0135, Apgars 7/9;  Surgeon: Macyn Shropshire Phenix, MD;  Location: [facility];  Service: Obstetrics;  Laterality: N/A;  . CESAREAN SECTION N/A 09/10/2017   Procedure: VAGINAL DELIVERY IN OPERATING ROOM;  Surgeon: Hermina Staggers, MD;  Location: [facility] BIRTHING SU[facility];  Service: Obstetrics;  Laterality: N/A;  . KNEE DISLOCATION SURGERY  2009   left  . LAPAROSCOPIC TUBAL LIGATION Bilateral 10/17/2017   Procedure: LAPAROSCOPIC TUBAL LIGATION;  Surgeon: Brittany [place] Bing, MD;  Location: [facility];  Service: Gynecology;   Laterality: Bilateral;  . TONSILLECTOMY AND ADENOIDECTOMY  01/16/2003  . UMBILICAL HERNIA REPAIR     > 10 yrs ago      OB History    Gravida  3   Para  3   Term  3   Preterm  0   AB  0   Living  3     SAB  0   TAB  0   Ectopic  0   Multiple  0   Live Births  3            Home Medications    Prior to Admission medications   Medication Sig Start Date End Date Taking? Authorizing Provider  acetaminophen (TYLENOL) 500 MG tablet Take 500 mg by mouth every 6 (six) hours as needed for headache.   Yes [provider]  albuterol (PROVENTIL HFA;VENTOLIN HFA) 108 (90 Base) MCG/ACT inhaler Inhale 2 puffs into the lungs every 6 (six) hours as needed for wheezing or shortness of breath. Patient not taking: Reported on 10/11/2017 05/22/17   Marny Lowenstein, PA-C  ibuprofen (ADVIL,MOTRIN) 600 MG tablet Take 1 tablet (600 mg total) by mouth every 6 (six) hours. Patient not taking: Reported on 01/31/2018 09/11/17   Ellwood Dense, DO  meclizine (ANTIVERT) 25 MG tablet Take 1 tablet (25 mg total) by mouth 3 (three) times daily as needed for up to 20 doses for dizziness. 01/31/18   Virgina Norfolk, DO    Family History Family History  Problem Relation Age of Onset  . Asthma Maternal Grandmother   . Diabetes Maternal Grandfather     Social History Social History   Tobacco Use  . Smoking status: Never Smoker  . Smokeless tobacco: Never Used  Substance Use Topics  . Alcohol use: No  . Drug use: No     Allergies   Patient has no known allergies.   Review of Systems Review of Systems  Constitutional: Negative for chills and fever.  HENT: Negative for ear pain, hearing loss, sore throat, tinnitus, trouble swallowing and voice change.   Eyes: Negative for pain and visual disturbance.  Respiratory: Negative for cough and shortness of breath.   Cardiovascular: Negative for chest pain, palpitations and syncope.  Gastrointestinal: Positive for nausea. Negative for  abdominal pain, blood in stool, diarrhea and vomiting.  Genitourinary: Negative for dysuria and hematuria.  Musculoskeletal: Negative for arthralgias and back pain.  Skin: Negative for color change and rash.  Neurological: Positive for dizziness. Negative for tremors, seizures, syncope, speech difficulty, weakness, light-headedness, numbness and headaches.  All other systems reviewed and are negative.    Physical Exam Updated Vital Signs  ED Triage Vitals  Enc Vitals Group     BP 01/31/18 1513 100/74     Pulse Rate 01/31/18 1513 74     Resp 01/31/18 1513 18     Temp 01/31/18 1513 98.4 F (36.9 C)     Temp Source 01/31/18 1513 Oral     SpO2 01/31/18 1513 100 %     Weight 01/31/18 1513 134 lb 6.4 oz (61 kg)     Height --      Head Circumference --      Peak Flow --      Pain Score 01/31/18 1544 8     Pain Loc --      Pain Edu? --      Excl. in GC? --     Physical Exam  Constitutional: She is oriented to person, place, and time. She appears well-developed and well-nourished. No distress.  HENT:  Head: Normocephalic and atraumatic.  Eyes: Pupils are equal, round, and reactive to light. Conjunctivae are normal.  Neck: Normal range of motion. Neck supple.  Cardiovascular: Normal rate, regular rhythm, normal heart sounds and intact distal pulses.  No murmur heard. Pulmonary/Chest: Effort normal and breath sounds normal. No stridor. No respiratory distress. She has no wheezes. She has no rales.  Abdominal: Soft. Bowel sounds are normal. There is no tenderness.  Musculoskeletal: She exhibits no edema.  Neurological: She is alert and oriented to person, place, and time. No cranial nerve deficit or sensory deficit. She exhibits normal muscle tone. Coordination normal.  5/5 strength throughout, normal sensation throughout, no drift, normal gait, normal finger-to-nose finger  Skin: Skin is warm and dry.  Psychiatric: She has a normal mood and affect.  Nursing note and vitals  reviewed.    ED Treatments / Results  Labs (all labs ordered are listed, but only abnormal results are displayed) Labs Reviewed  CBC - Abnormal; Notable for the following components:      Result Value   WBC 3.7 (*)    Hemoglobin 10.8 (*)    HCT 34.5 (*)    All other components within normal limits  BASIC METABOLIC PANEL  CBG MONITORING, ED  I-STAT BETA HCG BLOOD, ED ([facility], [facility], [facility] ONLY)    EKG EKG Interpretation  Date/Time:  Thursday January 31 2018 15:53:23 EDT Ventricular Rate:  66 PR Interval:  QRS Duration: 93 QT Interval:  418 QTC Calculation: 438 R Axis:   79 Text Interpretation:  Sinus rhythm Borderline short PR interval Baseline wander in lead(s) V6 Confirmed by Virgina NorfolkAdam, Milagros Middendorf [phone]([id number]) on 01/31/2018 5:52:24 PM   Radiology No results found.  Procedures Procedures (including critical care time)  Medications Ordered in ED Medications  meclizine (ANTIVERT) tablet 25 mg (25 mg Oral Given 01/31/18 1801)     Initial Impression / Assessment and Plan / ED Course  I have reviewed the triage vital signs and the nursing notes.  Pertinent labs & imaging results that were available during my care of the patient were reviewed by me and considered in my medical decision making (see chart for details).     Holly BodilyJertica S Vert is a 27 year old female history of asthma who presents to the ED with dizziness.  Patient states that she continues to have intermittent episodes of dizziness over the last several years.  Patient with normal vitals upon arrival.  No fever.  Patient states that his symptoms are worse when she is lying flat and feels as if the room is spinning.  Patient denies any loss of consciousness.  Symptoms also get worse with head movement.  Symptoms go away at rest.  Patient has normal neurological exam.  History and physical is consistent with peripheral vertigo.  No signs to suggest central process.  Patient had lab work ordered prior to my evaluation that showed no  significant anemia, electrolyte abnormality, acute kidney injury.  Patient with negative pregnancy test.  Patient denies any chest pain, shortness of breath.  No concern for pulmonary or cardiac etiology at this time.  EKG showed sinus rhythm with no signs to suggest Wolff-Parkinson-White, Brugada, HOCM.  She does have short PR but no delta wave.  Pt with improvement after meclizine.  Suspect that patient likely has peripheral vertigo and given prescription for meclizine and given information to follow-up with primary care.  Discharged from ED in good condition.  Final Clinical Impressions(s) / ED Diagnoses   Final diagnoses:  Vertigo    ED Discharge Orders         Ordered    meclizine (ANTIVERT) 25 MG tablet  3 times daily PRN,   Status:  Discontinued     01/31/18 1904    meclizine (ANTIVERT) 25 MG tablet  3 times daily PRN     01/31/18 1905          Virgina NorfolkCuratolo, Rakesh Dutko, DO 02/01/18 0102

## 2018-10-11 NOTE — Progress Notes (Signed)
COVID Hotel Screening performed. Temperature, PHQ-9, and need for medical care and medications assessed. No other needs reported.  Asra Gambrel RN MSN  

## 2018-12-19 ENCOUNTER — Other Ambulatory Visit: Payer: Self-pay

## 2018-12-19 ENCOUNTER — Encounter (HOSPITAL_COMMUNITY): Payer: Self-pay

## 2018-12-19 ENCOUNTER — Ambulatory Visit (HOSPITAL_COMMUNITY)
Admission: EM | Admit: 2018-12-19 | Discharge: 2018-12-19 | Disposition: A | Discharge status: Home / Self Care | Payer: Medicaid Other | Attending: Family Medicine | Admitting: Family Medicine

## 2018-12-19 DIAGNOSIS — M542 Cervicalgia: Secondary | ICD-10-CM

## 2018-12-19 DIAGNOSIS — G44311 Acute post-traumatic headache, intractable: Secondary | ICD-10-CM

## 2018-12-19 MED ORDER — NAPROXEN 500 MG PO TABS: 500.0000 mg | ORAL_TABLET | Freq: Two times a day (BID) | ORAL | 0 refills | Status: DC

## 2018-12-19 MED ORDER — BUTALBITAL-APAP-CAFFEINE 50-325-40 MG PO TABS: 1.0000 | ORAL_TABLET | Freq: Four times a day (QID) | ORAL | 0 refills | Status: DC | PRN

## 2018-12-19 MED ORDER — TIZANIDINE HCL 4 MG PO TABS: 4.0000 mg | ORAL_TABLET | Freq: Four times a day (QID) | ORAL | 0 refills | Status: DC | PRN

## 2018-12-19 NOTE — Discharge Instructions (Signed)
Take Naprosyn 2 times a day with food.  This is an anti-inflammatory pain medication Take tizanidine as needed for muscle spasms and muscle pain This is useful to take at bedtime Take the headache medication (Fioricet) as needed for severe headache.  When you take this medicine lie down and rest Activity as tolerated.  Avoid any prolonged standing walking or heavy lifting Ice or heat to sore muscles Expect improvement in a few days
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
## 2019-08-23 ENCOUNTER — Ambulatory Visit (HOSPITAL_COMMUNITY)
Admission: EM | Admit: 2019-08-23 | Discharge: 2019-08-23 | Disposition: A | Discharge status: Home / Self Care | Payer: Medicaid Other | Attending: Family Medicine | Admitting: Family Medicine

## 2019-08-23 ENCOUNTER — Encounter (HOSPITAL_COMMUNITY): Payer: Self-pay

## 2019-08-23 ENCOUNTER — Other Ambulatory Visit: Payer: Self-pay

## 2019-08-23 DIAGNOSIS — L089 Local infection of the skin and subcutaneous tissue, unspecified: Secondary | ICD-10-CM

## 2019-08-23 DIAGNOSIS — L02214 Cutaneous abscess of groin: Secondary | ICD-10-CM

## 2019-08-23 MED ORDER — LIDOCAINE-EPINEPHRINE (PF) 2 %-1:200000 IJ SOLN
INTRAMUSCULAR | Status: AC
  Filled 2019-08-23: qty 20

## 2019-08-23 MED ORDER — TRAMADOL HCL 50 MG PO TABS: 50.0000 mg | ORAL_TABLET | Freq: Four times a day (QID) | ORAL | 0 refills | Status: DC | PRN

## 2019-08-23 MED ORDER — SULFAMETHOXAZOLE-TRIMETHOPRIM 800-160 MG PO TABS: 1.0000 | ORAL_TABLET | Freq: Two times a day (BID) | ORAL | 0 refills | Status: DC

## 2019-08-23 MED ORDER — HYDROCODONE-ACETAMINOPHEN 5-325 MG PO TABS
1.0000 | ORAL_TABLET | Freq: Once | ORAL | Status: AC
  Administered 2019-08-23: 1 via ORAL

## 2019-08-23 MED ORDER — HYDROCODONE-ACETAMINOPHEN 5-325 MG PO TABS
ORAL_TABLET | ORAL | Status: AC
Start: 2019-08-23 — End: ?
  Filled 2019-08-23: qty 1

## 2019-08-23 NOTE — ED Triage Notes (Signed)
Pt c/o abscess to right groin for approx 3 days. Denies drainage, fever, chills, n/v/d. Last dose tylenol yesterday w/o improvement of pain.  Edematous abscess approx 4cm noted to right groin at fold.

## 2019-08-23 NOTE — Discharge Instructions (Signed)
Leave current dressing in place for at least 12 to 24 hours unless soiled.  If soiled redress.  You may remove wound packing in 24 hours.  Continue to wear dressing until you follow-up here in 3 days for wound evaluation.

## 2019-08-23 NOTE — ED Provider Notes (Signed)
[facility]    CSN: [id number] Arrival date & time: 08/23/19  1640      History   Chief Complaint Chief Complaint  Patient presents with  . Abscess    HPI Dana Mcconnell is a 29 y.o. female.    HPI  Patient presents today for I&D of an abscess to the right inner groin which has been present for about 3 days.  Patient denies fever, chills, drainage.  Abscess is tender has not drained.  She attempted relief with Tylenol on yesterday has not taken anything today.  Past Medical History:  Diagnosis Date  . Anemia    during pregnancy - no current med.  . Asthma    inhaler prn  . Sickle cell trait [facility])     Patient Active Problem List   Diagnosis Date Noted  . 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 Marijuana use 05/14/2017  . Depression 05/14/2017  . Asthma 03/20/2017  . ASCUS with positive high risk HPV cervical 03/20/2017  . Sickle cell trait (HCC) 03/14/2017    Past Surgical History:  Procedure Laterality Date  . CESAREAN SECTION N/A 05/02/2013   Procedure: Primary Ceasrean Section Delivery Baby Boy @ 0135, Apgars 7/9;  Surgeon: Adam Phenix, MD;  Location: [facility];  Service: Obstetrics;  Laterality: N/A;  . CESAREAN SECTION N/A 09/10/2017   Procedure: VAGINAL DELIVERY IN OPERATING ROOM;  Surgeon: Hermina Staggers, MD;  Location: [facility] BIRTHING SUITES;  Service: Obstetrics;  Laterality: N/A;  . KNEE DISLOCATION SURGERY  2009   left  . LAPAROSCOPIC TUBAL LIGATION Bilateral 10/17/2017   Procedure: LAPAROSCOPIC TUBAL LIGATION;  Surgeon: [place] Bing, MD;  Location: [facility];  Service: Gynecology;  Laterality: Bilateral;  . TONSILLECTOMY AND ADENOIDECTOMY  01/16/2003  . UMBILICAL HERNIA REPAIR     > 10 yrs ago     OB History    Gravida  3   Para  3   Term  3   Preterm  0   AB  0   Living  3     SAB  0   TAB  0   Ectopic  0   Multiple  0   Live Births  3            Home Medications    Prior to Admission  medications   Medication Sig Start Date End Date Taking? Authorizing Provider  acetaminophen (TYLENOL) 500 MG tablet Take 500 mg by mouth every 6 (six) hours as needed for headache.   Yes [provider]  butalbital-acetaminophen-caffeine (FIORICET) 50-325-40 MG tablet Take 1-2 tablets by mouth every 6 (six) hours as needed for headache. 12/19/18 12/19/19  Eustace Moore, MD  naproxen (NAPROSYN) 500 MG tablet Take 1 tablet (500 mg total) by mouth 2 (two) times daily. 12/19/18   Eustace Moore, MD  tiZANidine (ZANAFLEX) 4 MG tablet Take 1-2 tablets (4-8 mg total) by mouth every 6 (six) hours as needed for muscle spasms. 12/19/18   Eustace Moore, MD  albuterol (PROVENTIL HFA;VENTOLIN HFA) 108 (90 Base) MCG/ACT inhaler Inhale 2 puffs into the lungs every 6 (six) hours as needed for wheezing or shortness of breath. Patient not taking: Reported on 10/11/2017 05/22/17 12/19/18  Marny Lowenstein, PA-C    Family History Family History  Problem Relation Age of Onset  . Asthma Maternal Grandmother   . Diabetes Maternal Grandfather   . Healthy Mother   . Healthy Father     Social  History Social History   Tobacco Use  . Smoking status: Never Smoker  . Smokeless tobacco: Never Used  Substance Use Topics  . Alcohol use: No  . Drug use: No     Allergies   Patient has no known allergies.   Review of Systems Review of Systems Pertinent negatives listed in HPI  Physical Exam Triage Vital Signs ED Triage Vitals  Enc Vitals Group     BP 08/23/19 1701 117/72     Pulse Rate 08/23/19 1701 72     Resp 08/23/19 1701 20     Temp 08/23/19 1701 98.5 F (36.9 C)     Temp Source 08/23/19 1701 Oral     SpO2 08/23/19 1701 99 %     Weight --      Height --      Head Circumference --      Peak Flow --      Pain Score 08/23/19 1659 10     Pain Loc --      Pain Edu? --      Excl. in GC? --    No data found.  Updated Vital Signs BP 117/72 (BP Location: Right Arm)   Pulse 72   Temp  98.5 F (36.9 C) (Oral)   Resp 20   LMP 08/21/2019   SpO2 99%   Visual Acuity Right Eye Distance:   Left Eye Distance:   Bilateral Distance:    Right Eye Near:   Left Eye Near:    Bilateral Near:     Physical Exam Constitutional:      General: She is in acute distress.     Appearance: Normal appearance.     Comments: Acute distress related to pain  Cardiovascular:     Rate and Rhythm: Normal rate and regular rhythm.  Pulmonary:     Effort: Pulmonary effort is normal.     Breath sounds: Normal breath sounds.  Genitourinary:   Skin:    Findings: Abscess present.  Psychiatric:        Attention and Perception: Attention normal.        Mood and Affect: Mood normal.      UC Treatments / Results  Labs (all labs ordered are listed, but only abnormal results are displayed) Labs Reviewed - No data to display  EKG   Radiology No results found.  Procedures Incision and Drainage  Date/Time: 08/24/2019 9:52 AM Performed by: Bing Neighbors, FNP Authorized by: Bing Neighbors, FNP   Consent:    Consent obtained:  Verbal   Consent given by:  Patient   Risks discussed:  Incomplete drainage   Alternatives discussed:  No treatment and delayed treatment Location:    Type:  Abscess   Location:  Anogenital   Anogenital location: right groin region. Pre-procedure details:    Skin preparation:  Betadine Anesthesia (see MAR for exact dosages):    Anesthesia method:  Local infiltration   Local anesthetic:  Lidocaine 2% WITH epi Procedure type:    Complexity:  Complex Procedure details:    Needle aspiration: no     Incision types:  Stab incision   Incision depth:  Submucosal   Scalpel blade:  10   Wound management:  Irrigated with saline   Drainage:  Purulent   Drainage amount:  Copious   Wound treatment:  Wound left open   Packing materials:  1/4 in gauze   Amount 1/4":  7 inch Post-procedure details:    Patient tolerance of procedure:  Tolerated well, no  immediate complications   (including critical care time)  Medications Ordered in UC Medications - No data to display  Initial Impression / Assessment and Plan / UC Course  I have reviewed the triage vital signs and the nursing notes.  Pertinent labs & imaging results that were available during my care of the patient were reviewed by me and considered in my medical decision making (see chart for details).      Final Clinical Impressions(s) / UC Diagnoses   Final diagnoses:  Abscess of groin, right  Skin infection   I&D completed.  Patient tolerated although voice significant pain following procedure.  Prescribed a short course of tramadol for pain relief.  Cover patient with Bactrim twice daily for 10 days.  Patient is advised to return for wound evaluation in 3 days as a palpable indurated area was noted below the abscess.  The area was slightly tender and is likely a second abscess formation however given the solidity of the lesion I&D would not be successful today.  Wound care instructions provided.  Red flags discussed.  Patient verbalized understanding and agreement with plan.      Discharge Instructions     Leave current dressing in place for at least 12 to 24 hours unless soiled.  If soiled redress.  You may remove wound packing in 24 hours.  Continue to wear dressing until you follow-up here in 3 days for wound evaluation.    ED Prescriptions    Medication Sig Dispense Auth. Provider   sulfamethoxazole-trimethoprim (BACTRIM DS) 800-160 MG tablet Take 1 tablet by mouth 2 (two) times daily. 20 tablet Scot Jun, FNP   traMADol (ULTRAM) 50 MG tablet Take 1 tablet (50 mg total) by mouth every 6 (six) hours as needed. 15 tablet Scot Jun, FNP     PDMP not reviewed this encounter.   Scot Jun, [place] 08/24/19 [phone]

## 2019-08-24 DIAGNOSIS — L02214 Cutaneous abscess of groin: Secondary | ICD-10-CM | POA: Diagnosis not present

## 2019-08-24 DIAGNOSIS — L089 Local infection of the skin and subcutaneous tissue, unspecified: Secondary | ICD-10-CM | POA: Diagnosis not present

## 2019-11-25 ENCOUNTER — Other Ambulatory Visit: Payer: Self-pay

## 2019-11-25 ENCOUNTER — Encounter (HOSPITAL_COMMUNITY): Payer: Self-pay

## 2019-11-25 ENCOUNTER — Ambulatory Visit (HOSPITAL_COMMUNITY)
Admission: EM | Admit: 2019-11-25 | Discharge: 2019-11-25 | Disposition: A | Discharge status: Home / Self Care | Payer: Medicaid Other | Attending: Family Medicine | Admitting: Family Medicine

## 2019-11-25 DIAGNOSIS — M79641 Pain in right hand: Secondary | ICD-10-CM | POA: Diagnosis not present

## 2019-11-25 MED ORDER — HYDROCODONE-ACETAMINOPHEN 7.5-325 MG PO TABS: 1.0000 | ORAL_TABLET | Freq: Four times a day (QID) | ORAL | 0 refills | Status: AC | PRN

## 2019-11-25 MED ORDER — HYDROCODONE-ACETAMINOPHEN 5-325 MG PO TABS
ORAL_TABLET | ORAL | Status: AC
  Filled 2019-11-25: qty 1

## 2019-11-25 MED ORDER — PREDNISONE 10 MG (21) PO TBPK: ORAL_TABLET | Freq: Every day | ORAL | 0 refills | Status: AC

## 2019-11-25 MED ORDER — HYDROCODONE-ACETAMINOPHEN 5-325 MG PO TABS
1.0000 | ORAL_TABLET | Freq: Once | ORAL | Status: AC
  Administered 2019-11-25: 1 via ORAL

## 2019-11-25 NOTE — ED Provider Notes (Signed)
[facility]    CSN: [id number] Arrival date & time: 11/25/19  1053      History   Chief Complaint Chief Complaint  Patient presents with  . Wrist Pain  . Hand Pain    HPI Dana Mcconnell is a 29 y.o. female.   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 Anemia    during pregnancy - no current med.  . Asthma    inhaler prn  . Sickle cell trait [facility])     Patient Active Problem List   Diagnosis Date Noted  . 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 Marijuana use 05/14/2017  . Depression 05/14/2017  . Asthma 03/20/2017  . ASCUS with positive high risk HPV cervical 03/20/2017  . Sickle cell trait (HCC) 03/14/2017    Past Surgical History:  Procedure Laterality Date  . CESAREAN SECTION N/A 05/02/2013   Procedure: Primary Ceasrean Section Delivery Baby Boy @ 0135, Apgars 7/9;  Surgeon: Adam Phenix, MD;  Location: [facility];  Service: Obstetrics;  Laterality: N/A;  . CESAREAN SECTION N/A 09/10/2017   Procedure: VAGINAL DELIVERY IN OPERATING ROOM;  Surgeon: Hermina Staggers, MD;  Location: [facility] BIRTHING SUITES;  Service: Obstetrics;  Laterality: N/A;  . KNEE DISLOCATION SURGERY  2009   left  . LAPAROSCOPIC TUBAL LIGATION Bilateral 10/17/2017   Procedure: LAPAROSCOPIC TUBAL LIGATION;  Surgeon: [place] Bing, MD;  Location: [facility];  Service: Gynecology;  Laterality: Bilateral;  . TONSILLECTOMY AND ADENOIDECTOMY  01/16/2003  . UMBILICAL HERNIA REPAIR     > 10 yrs ago     OB History    Gravida  3     Para  3   Term  3   Preterm  0   AB  0   Living  3     SAB  0   TAB  0   Ectopic  0   Multiple  0   Live Births  3            Home Medications    Prior to Admission medications   Medication Sig Start Date End Date Taking? Authorizing Provider  acetaminophen (TYLENOL) 500 MG tablet Take 500 mg by mouth every 6 (six) hours as needed for headache.    [provider]  HYDROcodone-acetaminophen (NORCO) 7.5-325 MG tablet Take 1 tablet by mouth every 6 (six) hours as needed for moderate pain. 11/25/19   Eustace Moore, MD  predniSONE (STERAPRED UNI-PAK 21 TAB) 10 MG (21) TBPK tablet Take by mouth daily. Take 6 tabs by mouth daily  for 2 days, then 5 tabs for 2 days, then 4 tabs for 2 days, then 3 tabs for 2 days, 2 tabs for 2 days, then 1 tab by mouth daily for 2 days 11/25/19   Eustace Moore, MD  albuterol (PROVENTIL HFA;VENTOLIN HFA) 108 (90 Base) MCG/ACT inhaler Inhale 2 puffs into the lungs every 6 (six) hours as  needed for wheezing or shortness of breath. Patient not taking: Reported on 10/11/2017 05/22/17 12/19/18  Luvenia Redden, PA-C    Family History Family History  Problem Relation Age of Onset  . Asthma Maternal Grandmother   . Diabetes Maternal Grandfather   . Healthy Mother   . Healthy Father     Social History Social History   Tobacco Use  . Smoking status: Never Smoker  . Smokeless tobacco: Never Used  Substance Use Topics  . Alcohol use: No  . Drug use: No     Allergies   Patient has no known allergies.   Review of Systems Review of Systems  Musculoskeletal:       Hand pain     Physical Exam Triage Vital Signs ED Triage Vitals  Enc Vitals Group     BP 11/25/19 1216 115/72     Pulse Rate 11/25/19 1216 64     Resp 11/25/19 1216 18     Temp 11/25/19 1216 98.4 F (36.9 C)     Temp Source 11/25/19 1216 Oral     SpO2 11/25/19 1216 100 %     Weight --      Height --      Head Circumference --      Peak Flow --       Pain Score 11/25/19 1214 9     Pain Loc --      Pain Edu? --      Excl. in [place]? --    No data found.  Updated Vital Signs BP 115/72 (BP Location: Left Arm)   Pulse 64   Temp 98.4 F (36.9 C) (Oral)   Resp 18   LMP  (Within Months) Comment: 1 month  SpO2 100%      Physical Exam Constitutional:      General: She is not in acute distress.    Appearance: She is well-developed and normal weight.     Comments: Patient cradles arm close to body  HENT:     Head: Normocephalic and atraumatic.     Mouth/Throat:     Comments: Mask in place Eyes:     Conjunctiva/sclera: Conjunctivae normal.     Pupils: Pupils are equal, round, and reactive to light.  Cardiovascular:     Rate and Rhythm: Normal rate.  Pulmonary:     Effort: Pulmonary effort is normal. No respiratory distress.  Musculoskeletal:        General: Normal range of motion.     Cervical back: Normal range of motion.     Comments: Patient has no tenderness in her neck or trapezius region.  No tenderness in the right shoulder.  Limited movement of the shoulder, because any movement of the upper extremity causes increase in the right little finger pain.  She has tenderness to any palpation of the ulnar aspect of the right hand, the fourth or fifth finger or the thenar eminence up into the ulnar aspect of the carpal region.  Skin looks normal.  No swelling.  No warmth or discoloration.  Normal sensation.  Tenderness in the forearm, medial epicondyle, or cubital tunnel  Skin:    General: Skin is warm and dry.  Neurological:     Mental Status: She is alert.  Psychiatric:        Mood and Affect: Mood normal.        Behavior: Behavior normal.      UC Treatments / Results  Labs (all labs ordered are listed, but only abnormal  results are displayed) Labs Reviewed - No data to display  EKG   Radiology No results found.  Procedures Procedures (including critical care time)  Medications Ordered in UC Medications    HYDROcodone-acetaminophen (NORCO/VICODIN) 5-325 MG per tablet 1 tablet (1 tablet Oral Given 11/25/19 1244)    Initial Impression / Assessment and Plan / UC Course  I have reviewed the triage vital signs and the nursing notes.  Pertinent labs & imaging results that were available during my care of the patient were reviewed by me and considered in my medical decision making (see chart for details).     Appears to have an isolated ulnar neuropathy, distally in the hand.  The pain is from the wrist into the fingers.  No wrist injury.  Will treat with rest, ice, and anti-inflammatories.  Recommend hand specialist if fails to improve Final Clinical Impressions(s) / UC Diagnoses   Final diagnoses:  Right hand pain     Discharge Instructions      Rest arm Use ice or heat to area Take the prednisone as directed Take all of day one today Take the pain medicine if needed Do not drive on the pain medicine Call hand specialist if not improving in a few days      ED Prescriptions    Medication Sig Dispense Auth. Provider   predniSONE (STERAPRED UNI-PAK 21 TAB) 10 MG (21) TBPK tablet Take by mouth daily. Take 6 tabs by mouth daily  for 2 days, then 5 tabs for 2 days, then 4 tabs for 2 days, then 3 tabs for 2 days, 2 tabs for 2 days, then 1 tab by mouth daily for 2 days 42 tablet Eustace Moore, MD   HYDROcodone-acetaminophen (NORCO) 7.5-325 MG tablet Take 1 tablet by mouth every 6 (six) hours as needed for moderate pain. 15 tablet Eustace Moore, MD     I have reviewed the PDMP during this encounter.   Eustace Moore, MD 11/25/19 [phone]

## 2019-11-25 NOTE — ED Triage Notes (Signed)
Pt presents to UC with right wrist pain and right hand pain x 2 days. Not known y trauma or injury. "Kneedles sticky my hand" pain is worse when make a fist. Pt have not try any medication.

## 2019-11-25 NOTE — Discharge Instructions (Addendum)
  Rest arm Use ice or heat to area Take the prednisone as directed Take all of day one today Take the pain medicine if needed Do not drive on the pain medicine Call hand specialist if not improving in a few days

## 2021-02-18 ENCOUNTER — Encounter (HOSPITAL_COMMUNITY): Payer: Self-pay | Admitting: Emergency Medicine

## 2021-02-18 ENCOUNTER — Ambulatory Visit (HOSPITAL_COMMUNITY)
Admission: EM | Admit: 2021-02-18 | Discharge: 2021-02-18 | Disposition: A | Discharge status: Home / Self Care | Payer: Medicaid Other | Attending: Internal Medicine | Admitting: Internal Medicine

## 2021-02-18 DIAGNOSIS — H6122 Impacted cerumen, left ear: Secondary | ICD-10-CM

## 2021-02-18 DIAGNOSIS — H6120 Impacted cerumen, unspecified ear: Secondary | ICD-10-CM

## 2021-02-18 NOTE — ED Triage Notes (Signed)
Pt reports that left ear feels like needs to be popped. Reports little pain, but from where was messing with it this morning.

## 2021-02-18 NOTE — ED Provider Notes (Signed)
[facility]    CSN: [id number] Arrival date & time: 02/18/21  1036      History   Chief Complaint Chief Complaint  Patient presents with   Ear Fullness    HPI Dana Mcconnell is a 30 y.o. female comes to the urgent care with complaints of ear fullness in the left ear.  Symptoms started last night.  Patient has been trying to remove earwax with no success.  No fever or chills.  No discharge from the ears.  No nausea vomiting or diarrhea.  Patient denies any ringing in the ears or spinning sensation.Marland Kitchen   HPI  Past Medical History:  Diagnosis Date   Anemia    during pregnancy - no current med.   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 BIRTHING SUITES;  Service: Obstetrics;  Laterality: N/A;   KNEE DISLOCATION SURGERY  2009   left   LAPAROSCOPIC TUBAL LIGATION Bilateral 10/17/2017   Procedure: LAPAROSCOPIC TUBAL LIGATION;  Surgeon: [place] Bing, MD;  Location: [facility];  Service: Gynecology;  Laterality: Bilateral;   TONSILLECTOMY AND ADENOIDECTOMY  01/16/2003   UMBILICAL HERNIA REPAIR     > 10 yrs ago     OB History     Gravida  3   Para  3   Term  3   Preterm  0   AB  0   Living  3      SAB  0   IAB  0   Ectopic  0   Multiple  0   Live Births  3            Home  Medications    Prior to Admission medications   Medication Sig Start Date End Date Taking? Authorizing Provider  acetaminophen (TYLENOL) 500 MG tablet Take 500 mg by mouth every 6 (six) hours as needed for headache.    [provider]  HYDROcodone-acetaminophen (NORCO) 7.5-325 MG tablet Take 1 tablet by mouth every 6 (six) hours as needed for moderate pain. 11/25/19   Eustace Moore, MD  predniSONE (STERAPRED UNI-PAK 21 TAB) 10 MG (21) TBPK tablet Take by mouth daily. Take 6 tabs by mouth daily  for 2 days, then 5 tabs for 2 days, then 4 tabs for 2 days, then 3 tabs for 2 days, 2 tabs for 2 days, then 1 tab by mouth daily for 2 days 11/25/19   Eustace Moore, MD  albuterol (PROVENTIL HFA;VENTOLIN HFA) 108 (90 Base) MCG/ACT inhaler Inhale 2 puffs into the lungs every 6 (six) hours as needed for wheezing or shortness of breath. Patient not taking: Reported on 10/11/2017 05/22/17 12/19/18  Marny Lowenstein, PA-C    Family History Family History  Problem Relation Age of Onset   Asthma Maternal Grandmother  Diabetes Maternal Grandfather    Healthy Mother    Healthy Father     Social History Social History   Tobacco Use   Smoking status: Never   Smokeless tobacco: Never  Vaping Use   Vaping Use: Never used  Substance Use Topics   Alcohol use: No   Drug use: No     Allergies   Patient has no known allergies.   Review of Systems Review of Systems  HENT:  Positive for ear pain and hearing loss. Negative for ear discharge, facial swelling and mouth sores.   Neurological: Negative.     Physical Exam Triage Vital Signs ED Triage Vitals  Enc Vitals Group     BP 02/18/21 1115 99/66     Pulse Rate 02/18/21 1115 65     Resp --      Temp 02/18/21 1115 98.3 F (36.8 C)     Temp Source 02/18/21 1115 Oral     SpO2 02/18/21 1115 100 %     Weight --      Height --      Head Circumference --      Peak Flow --      Pain Score 02/18/21 1116 0     Pain Loc --      Pain  Edu? --      Excl. in GC? --    No data found.  Updated Vital Signs BP 99/66 (BP Location: Left Arm)   Pulse 65   Temp 98.3 F (36.8 C) (Oral)   LMP 02/16/2021 (Exact Date)   SpO2 100%   Visual Acuity Right Eye Distance:   Left Eye Distance:   Bilateral Distance:    Right Eye Near:   Left Eye Near:    Bilateral Near:     Physical Exam Vitals and nursing note reviewed.  Constitutional:      General: She is not in acute distress.    Appearance: She is not ill-appearing.  HENT:     Ears:     Comments: Impacted earwax in the left ear. Right ear tympanic membrane is without erythema. Cardiovascular:     Rate and Rhythm: Normal rate and regular rhythm.  Pulmonary:     Effort: Pulmonary effort is normal.     Breath sounds: Normal breath sounds.  Neurological:     Mental Status: She is alert.     UC Treatments / Results  Labs (all labs ordered are listed, but only abnormal results are displayed) Labs Reviewed - No data to display  EKG   Radiology No results found.  Procedures Procedures (including critical care time)  Medications Ordered in UC Medications - No data to display  Initial Impression / Assessment and Plan / UC Course  I have reviewed the triage vital signs and the nursing notes.  Pertinent labs & imaging results that were available during my care of the patient were reviewed by me and considered in my medical decision making (see chart for details).     1.  Hearing loss secondary to cerumen impaction: Debrox eardrops Patient is advised to use the Debrox for 3 days and if there is no improvement in his symptoms-she is advised to come to the urgent care to be reevaluated Final Clinical Impressions(s) / UC Diagnoses   Final diagnoses:  Hearing loss due to cerumen impaction, unspecified laterality     Discharge Instructions      Use eardrops as directed Return in 3 days if symptoms persist for ear irrigation  if symptoms are  persistent   ED Prescriptions   None    PDMP not reviewed this encounter.   Merrilee Jansky, MD 02/18/21 [phone]

## 2021-02-18 NOTE — Discharge Instructions (Addendum)
Use eardrops as directed Return in 3 days if symptoms persist for ear irrigation if symptoms are persistent
